# Patient Record
Sex: Female | Born: 1935 | Race: Black or African American | Hispanic: No | Smoking: Never smoker
Health system: Southern US, Community
[De-identification: ages and names within clinical notes are randomized; demographics above are authoritative.]

## PROBLEM LIST (undated history)

## (undated) DIAGNOSIS — E162 Hypoglycemia, unspecified: Secondary | ICD-10-CM

## (undated) DIAGNOSIS — J45909 Unspecified asthma, uncomplicated: Secondary | ICD-10-CM

## (undated) DIAGNOSIS — K5790 Diverticulosis of intestine, part unspecified, without perforation or abscess without bleeding: Secondary | ICD-10-CM

## (undated) DIAGNOSIS — M1990 Unspecified osteoarthritis, unspecified site: Secondary | ICD-10-CM

## (undated) DIAGNOSIS — E079 Disorder of thyroid, unspecified: Secondary | ICD-10-CM

## (undated) DIAGNOSIS — N289 Disorder of kidney and ureter, unspecified: Secondary | ICD-10-CM

## (undated) DIAGNOSIS — K219 Gastro-esophageal reflux disease without esophagitis: Secondary | ICD-10-CM

## (undated) DIAGNOSIS — I509 Heart failure, unspecified: Secondary | ICD-10-CM

## (undated) DIAGNOSIS — I2699 Other pulmonary embolism without acute cor pulmonale: Secondary | ICD-10-CM

## (undated) DIAGNOSIS — I639 Cerebral infarction, unspecified: Secondary | ICD-10-CM

## (undated) DIAGNOSIS — I4891 Unspecified atrial fibrillation: Secondary | ICD-10-CM

## (undated) DIAGNOSIS — I10 Essential (primary) hypertension: Secondary | ICD-10-CM

## (undated) DIAGNOSIS — I2510 Atherosclerotic heart disease of native coronary artery without angina pectoris: Secondary | ICD-10-CM

## (undated) HISTORY — PX: PACEMAKER PLACEMENT: SHX43

## (undated) HISTORY — PX: CYSTOSCOPY: SUR368

## (undated) HISTORY — PX: JOINT REPLACEMENT: SHX530

## (undated) HISTORY — PX: APPENDECTOMY: SHX54

---

## 1998-09-20 ENCOUNTER — Encounter: Admission: RE | Admit: 1998-09-20 | Discharge: 1998-09-20 | Payer: Self-pay | Admitting: Family Medicine

## 1998-10-12 ENCOUNTER — Encounter: Admission: RE | Admit: 1998-10-12 | Discharge: 1998-10-12 | Payer: Self-pay | Admitting: Family Medicine

## 1998-10-19 ENCOUNTER — Encounter: Admission: RE | Admit: 1998-10-19 | Discharge: 1998-10-19 | Payer: Self-pay | Admitting: Family Medicine

## 1998-11-09 ENCOUNTER — Encounter: Admission: RE | Admit: 1998-11-09 | Discharge: 1998-11-09 | Payer: Self-pay | Admitting: Family Medicine

## 1998-11-29 ENCOUNTER — Encounter: Admission: RE | Admit: 1998-11-29 | Discharge: 1998-11-29 | Payer: Self-pay | Admitting: Family Medicine

## 1999-01-30 ENCOUNTER — Encounter: Admission: RE | Admit: 1999-01-30 | Discharge: 1999-01-30 | Payer: Self-pay | Admitting: Family Medicine

## 1999-07-13 ENCOUNTER — Encounter: Admission: RE | Admit: 1999-07-13 | Discharge: 1999-07-13 | Payer: Self-pay | Admitting: Family Medicine

## 1999-07-16 ENCOUNTER — Ambulatory Visit (HOSPITAL_COMMUNITY): Admission: RE | Admit: 1999-07-16 | Discharge: 1999-07-16 | Payer: Self-pay | Admitting: Family Medicine

## 1999-07-16 ENCOUNTER — Encounter: Admission: RE | Admit: 1999-07-16 | Discharge: 1999-07-16 | Payer: Self-pay | Admitting: Family Medicine

## 1999-07-20 ENCOUNTER — Encounter: Admission: RE | Admit: 1999-07-20 | Discharge: 1999-07-20 | Payer: Self-pay | Admitting: Family Medicine

## 1999-07-31 ENCOUNTER — Encounter (INDEPENDENT_AMBULATORY_CARE_PROVIDER_SITE_OTHER): Payer: Self-pay | Admitting: *Deleted

## 1999-08-17 ENCOUNTER — Encounter: Admission: RE | Admit: 1999-08-17 | Discharge: 1999-08-17 | Payer: Self-pay | Admitting: Family Medicine

## 1999-12-07 ENCOUNTER — Encounter: Admission: RE | Admit: 1999-12-07 | Discharge: 1999-12-07 | Payer: Self-pay | Admitting: Family Medicine

## 2000-01-02 ENCOUNTER — Encounter: Admission: RE | Admit: 2000-01-02 | Discharge: 2000-01-02 | Payer: Self-pay | Admitting: Family Medicine

## 2000-01-17 ENCOUNTER — Encounter: Admission: RE | Admit: 2000-01-17 | Discharge: 2000-01-17 | Payer: Self-pay | Admitting: Family Medicine

## 2000-02-08 ENCOUNTER — Encounter: Admission: RE | Admit: 2000-02-08 | Discharge: 2000-02-08 | Payer: Self-pay | Admitting: Family Medicine

## 2000-05-16 ENCOUNTER — Encounter: Admission: RE | Admit: 2000-05-16 | Discharge: 2000-05-16 | Payer: Self-pay | Admitting: Family Medicine

## 2000-07-01 ENCOUNTER — Encounter: Admission: RE | Admit: 2000-07-01 | Discharge: 2000-07-01 | Payer: Self-pay | Admitting: Family Medicine

## 2000-07-03 ENCOUNTER — Encounter: Payer: Self-pay | Admitting: Emergency Medicine

## 2000-07-03 ENCOUNTER — Emergency Department (HOSPITAL_COMMUNITY): Admission: EM | Admit: 2000-07-03 | Discharge: 2000-07-03 | Payer: Self-pay | Admitting: Emergency Medicine

## 2000-07-11 ENCOUNTER — Ambulatory Visit (HOSPITAL_COMMUNITY): Admission: RE | Admit: 2000-07-11 | Discharge: 2000-07-11 | Payer: Self-pay | Admitting: Otolaryngology

## 2000-07-11 ENCOUNTER — Encounter: Payer: Self-pay | Admitting: Otolaryngology

## 2000-10-01 ENCOUNTER — Encounter: Admission: RE | Admit: 2000-10-01 | Discharge: 2000-10-01 | Payer: Self-pay | Admitting: Family Medicine

## 2001-04-17 ENCOUNTER — Ambulatory Visit (HOSPITAL_COMMUNITY): Admission: RE | Admit: 2001-04-17 | Discharge: 2001-04-17 | Payer: Self-pay | Admitting: Family Medicine

## 2001-04-17 ENCOUNTER — Encounter: Admission: RE | Admit: 2001-04-17 | Discharge: 2001-04-17 | Payer: Self-pay | Admitting: Family Medicine

## 2001-04-24 ENCOUNTER — Encounter: Admission: RE | Admit: 2001-04-24 | Discharge: 2001-04-24 | Payer: Self-pay | Admitting: Family Medicine

## 2001-08-21 ENCOUNTER — Encounter: Admission: RE | Admit: 2001-08-21 | Discharge: 2001-08-21 | Payer: Self-pay | Admitting: Family Medicine

## 2001-11-13 ENCOUNTER — Encounter: Admission: RE | Admit: 2001-11-13 | Discharge: 2001-11-13 | Payer: Self-pay | Admitting: Family Medicine

## 2002-03-12 ENCOUNTER — Encounter: Admission: RE | Admit: 2002-03-12 | Discharge: 2002-03-12 | Payer: Self-pay | Admitting: Family Medicine

## 2002-08-20 ENCOUNTER — Encounter: Admission: RE | Admit: 2002-08-20 | Discharge: 2002-08-20 | Payer: Self-pay | Admitting: Family Medicine

## 2002-10-08 ENCOUNTER — Encounter: Admission: RE | Admit: 2002-10-08 | Discharge: 2002-10-08 | Payer: Self-pay | Admitting: Family Medicine

## 2003-05-20 ENCOUNTER — Encounter: Admission: RE | Admit: 2003-05-20 | Discharge: 2003-05-20 | Payer: Self-pay | Admitting: Family Medicine

## 2003-10-28 ENCOUNTER — Encounter: Admission: RE | Admit: 2003-10-28 | Discharge: 2003-10-28 | Payer: Self-pay | Admitting: Family Medicine

## 2004-10-26 ENCOUNTER — Ambulatory Visit: Payer: Self-pay | Admitting: Sports Medicine

## 2004-11-14 ENCOUNTER — Ambulatory Visit: Payer: Self-pay | Admitting: Family Medicine

## 2005-07-15 ENCOUNTER — Ambulatory Visit: Payer: Self-pay | Admitting: Family Medicine

## 2005-10-07 ENCOUNTER — Ambulatory Visit: Payer: Self-pay | Admitting: Family Medicine

## 2005-11-13 ENCOUNTER — Ambulatory Visit: Payer: Self-pay | Admitting: Family Medicine

## 2005-12-17 ENCOUNTER — Ambulatory Visit: Payer: Self-pay | Admitting: Sports Medicine

## 2006-04-15 ENCOUNTER — Ambulatory Visit: Payer: Self-pay | Admitting: Family Medicine

## 2006-04-25 ENCOUNTER — Encounter: Admission: RE | Admit: 2006-04-25 | Discharge: 2006-04-25 | Payer: Self-pay | Admitting: Sports Medicine

## 2006-04-25 ENCOUNTER — Ambulatory Visit: Payer: Self-pay | Admitting: Family Medicine

## 2006-05-14 ENCOUNTER — Ambulatory Visit: Payer: Self-pay | Admitting: Family Medicine

## 2006-07-04 ENCOUNTER — Ambulatory Visit: Payer: Self-pay | Admitting: Family Medicine

## 2006-07-17 ENCOUNTER — Ambulatory Visit: Payer: Self-pay | Admitting: Family Medicine

## 2006-07-30 DIAGNOSIS — K219 Gastro-esophageal reflux disease without esophagitis: Secondary | ICD-10-CM

## 2006-07-30 DIAGNOSIS — K5790 Diverticulosis of intestine, part unspecified, without perforation or abscess without bleeding: Secondary | ICD-10-CM

## 2006-07-30 HISTORY — PX: ESOPHAGOGASTRODUODENOSCOPY: SHX1529

## 2006-07-30 HISTORY — PX: COLONOSCOPY: SHX174

## 2006-07-30 HISTORY — DX: Gastro-esophageal reflux disease without esophagitis: K21.9

## 2006-07-30 HISTORY — DX: Diverticulosis of intestine, part unspecified, without perforation or abscess without bleeding: K57.90

## 2006-08-07 ENCOUNTER — Emergency Department (HOSPITAL_COMMUNITY): Admission: EM | Admit: 2006-08-07 | Discharge: 2006-08-07 | Payer: Self-pay | Admitting: Emergency Medicine

## 2006-08-07 ENCOUNTER — Ambulatory Visit (HOSPITAL_COMMUNITY): Admission: RE | Admit: 2006-08-07 | Discharge: 2006-08-07 | Payer: Self-pay | Admitting: Gastroenterology

## 2006-08-13 ENCOUNTER — Ambulatory Visit: Payer: Self-pay | Admitting: Sports Medicine

## 2006-12-25 ENCOUNTER — Ambulatory Visit: Payer: Self-pay | Admitting: Family Medicine

## 2006-12-27 ENCOUNTER — Emergency Department (HOSPITAL_COMMUNITY): Admission: EM | Admit: 2006-12-27 | Discharge: 2006-12-27 | Payer: Self-pay | Admitting: Emergency Medicine

## 2007-01-16 ENCOUNTER — Encounter: Payer: Self-pay | Admitting: Family Medicine

## 2007-01-16 ENCOUNTER — Ambulatory Visit: Payer: Self-pay | Admitting: Family Medicine

## 2007-01-16 LAB — CONVERTED CEMR LAB
ALT: 13 units/L (ref 0–35)
AST: 14 units/L (ref 0–37)
Albumin: 3.8 g/dL (ref 3.5–5.2)
Alkaline Phosphatase: 75 units/L (ref 39–117)
CO2: 24 meq/L (ref 19–32)
Calcium: 9.3 mg/dL (ref 8.4–10.5)
Chloride: 98 meq/L (ref 96–112)
Creatinine, Ser: 1.36 mg/dL — ABNORMAL HIGH (ref 0.40–1.20)
Total Bilirubin: 0.3 mg/dL (ref 0.3–1.2)

## 2007-01-24 ENCOUNTER — Emergency Department (HOSPITAL_COMMUNITY): Admission: EM | Admit: 2007-01-24 | Discharge: 2007-01-24 | Payer: Self-pay | Admitting: Emergency Medicine

## 2007-01-30 ENCOUNTER — Encounter: Payer: Self-pay | Admitting: Family Medicine

## 2007-01-30 ENCOUNTER — Ambulatory Visit: Payer: Self-pay | Admitting: Family Medicine

## 2007-01-30 LAB — CONVERTED CEMR LAB
CO2: 25 meq/L (ref 19–32)
Calcium: 9.3 mg/dL (ref 8.4–10.5)
Chloride: 101 meq/L (ref 96–112)
Creatinine, Ser: 1.51 mg/dL — ABNORMAL HIGH (ref 0.40–1.20)
Glucose, Bld: 94 mg/dL (ref 70–99)

## 2007-02-26 DIAGNOSIS — I4891 Unspecified atrial fibrillation: Secondary | ICD-10-CM | POA: Insufficient documentation

## 2007-02-26 DIAGNOSIS — R42 Dizziness and giddiness: Secondary | ICD-10-CM | POA: Insufficient documentation

## 2007-02-26 DIAGNOSIS — K5730 Diverticulosis of large intestine without perforation or abscess without bleeding: Secondary | ICD-10-CM | POA: Insufficient documentation

## 2007-02-26 DIAGNOSIS — I10 Essential (primary) hypertension: Secondary | ICD-10-CM | POA: Insufficient documentation

## 2007-02-26 DIAGNOSIS — J309 Allergic rhinitis, unspecified: Secondary | ICD-10-CM | POA: Insufficient documentation

## 2007-02-26 DIAGNOSIS — M1990 Unspecified osteoarthritis, unspecified site: Secondary | ICD-10-CM

## 2007-02-26 DIAGNOSIS — M79609 Pain in unspecified limb: Secondary | ICD-10-CM

## 2007-02-26 DIAGNOSIS — E669 Obesity, unspecified: Secondary | ICD-10-CM | POA: Insufficient documentation

## 2007-02-27 ENCOUNTER — Encounter (INDEPENDENT_AMBULATORY_CARE_PROVIDER_SITE_OTHER): Payer: Self-pay | Admitting: *Deleted

## 2007-04-02 ENCOUNTER — Encounter: Payer: Self-pay | Admitting: Family Medicine

## 2007-05-13 ENCOUNTER — Encounter (INDEPENDENT_AMBULATORY_CARE_PROVIDER_SITE_OTHER): Payer: Self-pay | Admitting: *Deleted

## 2007-07-17 ENCOUNTER — Encounter (INDEPENDENT_AMBULATORY_CARE_PROVIDER_SITE_OTHER): Payer: Self-pay | Admitting: Family Medicine

## 2015-02-26 ENCOUNTER — Inpatient Hospital Stay (HOSPITAL_COMMUNITY)
Admission: EM | Admit: 2015-02-26 | Discharge: 2015-03-02 | DRG: 392 | Disposition: A | Discharge status: Skilled Nursing Facility | Payer: Medicare Other | Attending: Family Medicine | Admitting: Family Medicine

## 2015-02-26 ENCOUNTER — Emergency Department (HOSPITAL_COMMUNITY): Payer: Medicare Other

## 2015-02-26 ENCOUNTER — Encounter (HOSPITAL_COMMUNITY): Payer: Self-pay | Admitting: Emergency Medicine

## 2015-02-26 DIAGNOSIS — Z7982 Long term (current) use of aspirin: Secondary | ICD-10-CM

## 2015-02-26 DIAGNOSIS — J4520 Mild intermittent asthma, uncomplicated: Secondary | ICD-10-CM

## 2015-02-26 DIAGNOSIS — D72819 Decreased white blood cell count, unspecified: Secondary | ICD-10-CM

## 2015-02-26 DIAGNOSIS — R112 Nausea with vomiting, unspecified: Secondary | ICD-10-CM | POA: Diagnosis present

## 2015-02-26 DIAGNOSIS — R1084 Generalized abdominal pain: Secondary | ICD-10-CM

## 2015-02-26 DIAGNOSIS — N183 Chronic kidney disease, stage 3 unspecified: Secondary | ICD-10-CM | POA: Diagnosis present

## 2015-02-26 DIAGNOSIS — R1314 Dysphagia, pharyngoesophageal phase: Secondary | ICD-10-CM

## 2015-02-26 DIAGNOSIS — A084 Viral intestinal infection, unspecified: Secondary | ICD-10-CM | POA: Diagnosis not present

## 2015-02-26 DIAGNOSIS — R1310 Dysphagia, unspecified: Secondary | ICD-10-CM

## 2015-02-26 DIAGNOSIS — I10 Essential (primary) hypertension: Secondary | ICD-10-CM | POA: Diagnosis present

## 2015-02-26 DIAGNOSIS — J45909 Unspecified asthma, uncomplicated: Secondary | ICD-10-CM | POA: Diagnosis present

## 2015-02-26 DIAGNOSIS — N179 Acute kidney failure, unspecified: Secondary | ICD-10-CM | POA: Diagnosis present

## 2015-02-26 DIAGNOSIS — I129 Hypertensive chronic kidney disease with stage 1 through stage 4 chronic kidney disease, or unspecified chronic kidney disease: Secondary | ICD-10-CM | POA: Diagnosis present

## 2015-02-26 DIAGNOSIS — Z86711 Personal history of pulmonary embolism: Secondary | ICD-10-CM

## 2015-02-26 DIAGNOSIS — I5032 Chronic diastolic (congestive) heart failure: Secondary | ICD-10-CM | POA: Diagnosis present

## 2015-02-26 DIAGNOSIS — Z96653 Presence of artificial knee joint, bilateral: Secondary | ICD-10-CM | POA: Diagnosis present

## 2015-02-26 DIAGNOSIS — Z7901 Long term (current) use of anticoagulants: Secondary | ICD-10-CM

## 2015-02-26 DIAGNOSIS — Z950 Presence of cardiac pacemaker: Secondary | ICD-10-CM

## 2015-02-26 DIAGNOSIS — E785 Hyperlipidemia, unspecified: Secondary | ICD-10-CM | POA: Diagnosis present

## 2015-02-26 DIAGNOSIS — Z8673 Personal history of transient ischemic attack (TIA), and cerebral infarction without residual deficits: Secondary | ICD-10-CM

## 2015-02-26 DIAGNOSIS — D649 Anemia, unspecified: Secondary | ICD-10-CM

## 2015-02-26 DIAGNOSIS — I4891 Unspecified atrial fibrillation: Secondary | ICD-10-CM | POA: Diagnosis present

## 2015-02-26 DIAGNOSIS — M069 Rheumatoid arthritis, unspecified: Secondary | ICD-10-CM | POA: Diagnosis present

## 2015-02-26 DIAGNOSIS — R079 Chest pain, unspecified: Secondary | ICD-10-CM | POA: Diagnosis present

## 2015-02-26 DIAGNOSIS — Z79899 Other long term (current) drug therapy: Secondary | ICD-10-CM

## 2015-02-26 DIAGNOSIS — I482 Chronic atrial fibrillation: Secondary | ICD-10-CM | POA: Diagnosis present

## 2015-02-26 DIAGNOSIS — K224 Dyskinesia of esophagus: Secondary | ICD-10-CM | POA: Diagnosis present

## 2015-02-26 DIAGNOSIS — I2510 Atherosclerotic heart disease of native coronary artery without angina pectoris: Secondary | ICD-10-CM | POA: Diagnosis present

## 2015-02-26 DIAGNOSIS — D61818 Other pancytopenia: Secondary | ICD-10-CM | POA: Diagnosis present

## 2015-02-26 DIAGNOSIS — E860 Dehydration: Secondary | ICD-10-CM | POA: Diagnosis present

## 2015-02-26 DIAGNOSIS — R197 Diarrhea, unspecified: Secondary | ICD-10-CM

## 2015-02-26 DIAGNOSIS — I509 Heart failure, unspecified: Secondary | ICD-10-CM

## 2015-02-26 HISTORY — DX: Other pulmonary embolism without acute cor pulmonale: I26.99

## 2015-02-26 HISTORY — DX: Essential (primary) hypertension: I10

## 2015-02-26 HISTORY — DX: Unspecified osteoarthritis, unspecified site: M19.90

## 2015-02-26 HISTORY — DX: Cerebral infarction, unspecified: I63.9

## 2015-02-26 HISTORY — DX: Unspecified atrial fibrillation: I48.91

## 2015-02-26 HISTORY — DX: Unspecified asthma, uncomplicated: J45.909

## 2015-02-26 HISTORY — DX: Heart failure, unspecified: I50.9

## 2015-02-26 HISTORY — DX: Disorder of kidney and ureter, unspecified: N28.9

## 2015-02-26 HISTORY — DX: Atherosclerotic heart disease of native coronary artery without angina pectoris: I25.10

## 2015-02-26 HISTORY — DX: Diverticulosis of intestine, part unspecified, without perforation or abscess without bleeding: K57.90

## 2015-02-26 HISTORY — DX: Disorder of thyroid, unspecified: E07.9

## 2015-02-26 HISTORY — DX: Hypoglycemia, unspecified: E16.2

## 2015-02-26 HISTORY — DX: Gastro-esophageal reflux disease without esophagitis: K21.9

## 2015-02-26 LAB — URINALYSIS, ROUTINE W REFLEX MICROSCOPIC
Bilirubin Urine: NEGATIVE
Glucose, UA: NEGATIVE mg/dL
KETONES UR: NEGATIVE mg/dL
NITRITE: NEGATIVE
PH: 7 (ref 5.0–8.0)
PROTEIN: NEGATIVE mg/dL
Specific Gravity, Urine: 1.011 (ref 1.005–1.030)
Urobilinogen, UA: 1 mg/dL (ref 0.0–1.0)

## 2015-02-26 LAB — I-STAT TROPONIN, ED: Troponin i, poc: 0.01 ng/mL (ref 0.00–0.08)

## 2015-02-26 LAB — CBC WITH DIFFERENTIAL/PLATELET
BASOS ABS: 0 10*3/uL (ref 0.0–0.1)
Basophils Relative: 1 % (ref 0–1)
EOS ABS: 0.1 10*3/uL (ref 0.0–0.7)
Eosinophils Relative: 3 % (ref 0–5)
HCT: 26.3 % — ABNORMAL LOW (ref 36.0–46.0)
HEMOGLOBIN: 8.9 g/dL — AB (ref 12.0–15.0)
Lymphocytes Relative: 57 % — ABNORMAL HIGH (ref 12–46)
Lymphs Abs: 1 10*3/uL (ref 0.7–4.0)
MCH: 30 pg (ref 26.0–34.0)
MCHC: 33.8 g/dL (ref 30.0–36.0)
MCV: 88.6 fL (ref 78.0–100.0)
MONO ABS: 0.2 10*3/uL (ref 0.1–1.0)
Monocytes Relative: 11 % (ref 3–12)
NEUTROS ABS: 0.5 10*3/uL — AB (ref 1.7–7.7)
NEUTROS PCT: 28 % — AB (ref 43–77)
Platelets: 133 10*3/uL — ABNORMAL LOW (ref 150–400)
RBC: 2.97 MIL/uL — AB (ref 3.87–5.11)
RDW: 20.4 % — ABNORMAL HIGH (ref 11.5–15.5)
WBC: 1.8 10*3/uL — ABNORMAL LOW (ref 4.0–10.5)

## 2015-02-26 LAB — COMPREHENSIVE METABOLIC PANEL
ALBUMIN: 3.1 g/dL — AB (ref 3.5–5.2)
ALT: 13 U/L (ref 0–35)
AST: 22 U/L (ref 0–37)
Alkaline Phosphatase: 79 U/L (ref 39–117)
Anion gap: 6 (ref 5–15)
BILIRUBIN TOTAL: 0.9 mg/dL (ref 0.3–1.2)
BUN: 16 mg/dL (ref 6–23)
CO2: 27 mmol/L (ref 19–32)
CREATININE: 1.34 mg/dL — AB (ref 0.50–1.10)
Calcium: 8.7 mg/dL (ref 8.4–10.5)
Chloride: 105 mmol/L (ref 96–112)
GFR calc non Af Amer: 35 mL/min — ABNORMAL LOW (ref >90)
GFR, EST AFRICAN AMERICAN: 41 mL/min — AB (ref >90)
GLUCOSE: 92 mg/dL (ref 70–99)
Potassium: 3.6 mmol/L (ref 3.5–5.1)
SODIUM: 138 mmol/L (ref 135–145)
Total Protein: 6.7 g/dL (ref 6.0–8.3)

## 2015-02-26 LAB — URINE MICROSCOPIC-ADD ON

## 2015-02-26 LAB — LIPASE, BLOOD: LIPASE: 33 U/L (ref 11–59)

## 2015-02-26 LAB — PROTIME-INR
INR: 3.3 — ABNORMAL HIGH (ref 0.00–1.49)
Prothrombin Time: 33.8 seconds — ABNORMAL HIGH (ref 11.6–15.2)

## 2015-02-26 LAB — POC OCCULT BLOOD, ED: Fecal Occult Bld: POSITIVE — AB

## 2015-02-26 MED ORDER — ONDANSETRON 4 MG PO TBDP
4.0000 mg | ORAL_TABLET | Freq: Once | ORAL | Status: AC
  Administered 2015-02-26: 4 mg via ORAL
  Filled 2015-02-26: qty 1

## 2015-02-26 MED ORDER — GI COCKTAIL ~~LOC~~
30.0000 mL | Freq: Once | ORAL | Status: AC
  Administered 2015-02-26: 30 mL via ORAL
  Filled 2015-02-26: qty 30

## 2015-02-26 MED ORDER — OXYCODONE-ACETAMINOPHEN 5-325 MG PO TABS
1.0000 | ORAL_TABLET | Freq: Once | ORAL | Status: AC
  Administered 2015-02-26: 1 via ORAL
  Filled 2015-02-26: qty 1

## 2015-02-26 NOTE — ED Notes (Signed)
MD Miller at bedside. 

## 2015-02-26 NOTE — ED Notes (Signed)
Phlebotomy at bedside.

## 2015-02-26 NOTE — ED Notes (Signed)
PA Tori at bedside. 

## 2015-02-26 NOTE — ED Provider Notes (Signed)
CSN: [id number]     Arrival date & time 02/26/15  1832 History   First MD Initiated Contact with Patient 02/26/15 1836     No chief complaint on file.    (Consider location/radiation/quality/duration/timing/severity/associated sxs/prior Treatment) HPI  Patricia Klein is a 81 y.o. female with PMH of congestive heart failure, atrial fibrillation on Coumadin, coronary artery disease, stroke presenting with 2 weeks of nausea, vomiting, diarrhea. Patient states she has epigastric/chest pain that is worse with eating and is a burn. Pain is constant. No shortness of breath. Pt with history of CHF with decreased swelling in bilateral legs. Patient states that she was admitted for similar symptoms at [facility] and discharged 4 days ago. Patient has also seen her primary care provider and had repeat lab work done. Was diagnosed with thrombocytopenia and possible gastritis. Patient with GI follow-up March 8. She is also to follow-up with hematology. Patient denies any change in her symptoms she has not seen a what is going on. No new medication. She has stopped taking metoprolol   Past Medical History  Diagnosis Date  . Asthma   . Arthritis   . CHF (congestive heart failure)   . Thyroid disease   . Stroke   . Coronary artery disease   . Hypoglycemia   . Hypertension   . Renal disorder   . Atrial fibrillation    Past Surgical History  Procedure Laterality Date  . Joint replacement      bilateral knee and RT hip  . Pacemaker placement     No family history on file. History  Substance Use Topics  . Smoking status: Never Smoker   . Smokeless tobacco: Not on file  . Alcohol Use: No   OB History    No data available     Review of Systems 10 Systems reviewed and are negative for acute change except as noted in the HPI.    Allergies  Gabapentin; Penicillins; Sulfa antibiotics; and Hydrocodone-acetaminophen  Home Medications   Prior to Admission medications    Medication Sig Start Date End Date Taking? Authorizing Provider  albuterol (PROVENTIL HFA;VENTOLIN HFA) 108 (90 BASE) MCG/ACT inhaler Inhale 2 puffs into the lungs every 6 (six) hours as needed for wheezing or shortness of breath.    Yes Historical Provider, MD  aspirin EC 81 MG tablet Take 81 mg by mouth daily.   Yes Historical Provider, MD  cholecalciferol (VITAMIN D) 1000 UNITS tablet Take 1,000 Units by mouth daily.   Yes Historical Provider, MD  diltiazem (CARDIZEM) 30 MG tablet Take 30 mg by mouth 2 (two) times daily. 12/28/14  Yes Historical Provider, MD  furosemide (LASIX) 40 MG tablet Take 40 mg by mouth 2 (two) times daily. 12/28/14  Yes Historical Provider, MD  metoprolol (LOPRESSOR) 100 MG tablet Take 100 mg by mouth 2 (two) times daily.  02/02/15  Yes Historical Provider, MD  nitroGLYCERIN (NITROSTAT) 0.4 MG SL tablet Place 0.4 mg under the tongue every 5 (five) minutes as needed for chest pain.    Yes Historical Provider, MD  nystatin (MYCOSTATIN/NYSTOP) 100000 UNIT/GM POWD Apply topically 3 (three) times daily as needed (irritation under breasts, under stomach folds and vaginal area).  01/25/15  Yes Historical Provider, MD  oxyCODONE-acetaminophen (PERCOCET/ROXICET) 5-325 MG per tablet Take 1 tablet by mouth every 6 (six) hours as needed (pain).  02/17/15  Yes Historical Provider, MD  potassium chloride SA (K-DUR,KLOR-CON) 20 MEQ tablet Take 20 mEq by mouth daily.  02/22/15  Yes Historical Provider, MD  pravastatin (PRAVACHOL) 20 MG tablet Take 20 mg by mouth at bedtime.  02/23/15  Yes Historical Provider, MD  sennosides-docusate sodium (SENOKOT-S) 8.6-50 MG tablet Take 1 tablet by mouth daily as needed.    Yes Historical Provider, MD  traMADol (ULTRAM) 50 MG tablet Take 50 mg by mouth 2 (two) times daily. 02/17/15  Yes Historical Provider, MD  warfarin (COUMADIN) 2 MG tablet Take 1-2 mg by mouth daily after supper. Take 1/2 tablet (1 mg) on Wednesday and Friday at 5pm; take 1 tablet (2 mg)  on Sunday, Monday, Tuesday, Thursday, Saturday at 5pm 02/07/15  Yes Historical Provider, MD  methotrexate (RHEUMATREX) 2.5 MG tablet 15 mg once a week. On Mondays 02/07/15   Historical Provider, MD   BP 108/54 mmHg  Pulse 65  Temp(Src) 97.8 F (36.6 C) (Rectal)  Resp 18  SpO2 96% Physical Exam  Constitutional: She appears well-developed and well-nourished. No distress.  HENT:  Head: Normocephalic and atraumatic.  Eyes: Conjunctivae and EOM are normal. Right eye exhibits no discharge. Left eye exhibits no discharge.  Neck:  Elevated venous pulsations.  Cardiovascular: Normal rate and regular rhythm.   1+ pitting edema equal bilaterally  Pulmonary/Chest: Effort normal and breath sounds normal. No respiratory distress. She has no wheezes.  Abdominal: Soft. Bowel sounds are normal. She exhibits no distension. There is no tenderness.  Genitourinary:  External rectum without tenderness to palpation without visible fissures. Internal rectum with normal tone without masses or lesions. Stool light brown.  Nursing tech in room during exam.   Neurological: She is alert. She exhibits normal muscle tone. Coordination normal.  Skin: Skin is warm and dry. She is not diaphoretic.  Nursing note and vitals reviewed.   ED Course  Procedures (including critical care time) Labs Review Labs Reviewed  CBC WITH DIFFERENTIAL/PLATELET - Abnormal; Notable for the following:    WBC 1.8 (*)    RBC 2.97 (*)    Hemoglobin 8.9 (*)    HCT 26.3 (*)    RDW 20.4 (*)    Platelets 133 (*)    Neutrophils Relative % 28 (*)    Lymphocytes Relative 57 (*)    Neutro Abs 0.5 (*)    All other components within normal limits  COMPREHENSIVE METABOLIC PANEL - Abnormal; Notable for the following:    Creatinine, Ser 1.34 (*)    Albumin 3.1 (*)    GFR calc non Af Amer 35 (*)    GFR calc Af Amer 41 (*)    All other components within normal limits  PROTIME-INR - Abnormal; Notable for the following:    Prothrombin Time 33.8  (*)    INR 3.30 (*)    All other components within normal limits  URINALYSIS, ROUTINE W REFLEX MICROSCOPIC - Abnormal; Notable for the following:    APPearance CLOUDY (*)    Hgb urine dipstick SMALL (*)    Leukocytes, UA TRACE (*)    All other components within normal limits  URINE MICROSCOPIC-ADD ON - Abnormal; Notable for the following:    Squamous Epithelial / LPF MANY (*)    All other components within normal limits  POC OCCULT BLOOD, ED - Abnormal; Notable for the following:    Fecal Occult Bld POSITIVE (*)    All other components within normal limits  LIPASE, BLOOD  I-STAT TROPOININ, ED    Imaging Review Dg Chest 2 View  02/26/2015   CLINICAL DATA:  Chest pain and weakness for 2 days  EXAM: CHEST  2 VIEW  COMPARISON:  02/14/2015  FINDINGS: Cardiac shadow remains enlarged. A pacing device is again seen. Degenerative changes of the shoulder joints are again noted bilaterally. No focal infiltrate or sizable effusion is seen.  IMPRESSION: No acute abnormality noted.   Electronically Signed   By: Alcide Clever M.D.   On: 02/26/2015 20:06     EKG Interpretation   Date/Time:  Sunday February 26 2015 18:53:27 EST Ventricular Rate:  73 PR Interval:  94 QRS Duration: 117 QT Interval:  439 QTC Calculation: 484 R Axis:   82 Text Interpretation:  Ventricular-paced complexes No further rhythm  analysis attempted due to paced rhythm Incomplete right bundle branch  block Borderline low voltage, extremity leads Baseline wander in lead(s)  V4 since 2007, no sig changes Confirmed by Hyacinth Meeker  MD, BRIAN ([id number]) on  02/26/2015 7:59:46 PM      MDM   Final diagnoses:  Generalized abdominal pain  Chest pain, unspecified chest pain type  Nausea vomiting and diarrhea  Anemia, unspecified anemia type  Leukopenia   Pt with complaint of nausea, vomiting, diarrhea as well as epigastric abdominal tenderness for 2 weeks. Pain is burning and worse with eating. Pt also with admission to [facility]  regional found to have thrombocytopenia and leukocytopenia. Pt on methotrexate for RA but no longer taking. VSS. Patient with generalized abdominal pain as well as chest pain reproducible on exam. Patient without electrolyte abnormalities with with blood cell count 1.8 which is decreased from 2 days ago that was 3.6 at [facility]. Patient also with anemia of 8.9 which is at her baseline. She is on anticoagulants with INR of 3.3 and reports dark stools. Fecal positive and stool light brown. Otherwise troponin negative EKG without acute abnormalities. UA without any evidence of infection. Patient given GI cocktail in ED with resolution of her symptoms. I doubt acute abdominal process. Consult to hospitalist for observation with serial abdominal exams. Spoke with Dr. Clyde Lundborg who agrees to evaluate the patient and admit.  Discussed return precautions with patient. Discussed all results and patient verbalizes understanding and agrees with plan.  This is a shared patient. This patient was discussed with the physician who saw and evaluated the patient and agrees with the plan.     Louann Sjogren, PA-C 02/27/15 0110  Vida Roller, MD 02/27/15 2010

## 2015-02-26 NOTE — ED Notes (Signed)
Per EMS, pt from home c/o of N/V/D x 2 weeks. Pt states she has no appetite. Pt was seen at [facility] on 2/18 for similar symptoms. Pt c/o of 8/10 mouth and throat soreness. NAD noted. VSS.

## 2015-02-27 ENCOUNTER — Encounter (HOSPITAL_COMMUNITY): Payer: Self-pay | Admitting: Internal Medicine

## 2015-02-27 DIAGNOSIS — R197 Diarrhea, unspecified: Secondary | ICD-10-CM

## 2015-02-27 DIAGNOSIS — I2510 Atherosclerotic heart disease of native coronary artery without angina pectoris: Secondary | ICD-10-CM | POA: Diagnosis present

## 2015-02-27 DIAGNOSIS — J45909 Unspecified asthma, uncomplicated: Secondary | ICD-10-CM | POA: Diagnosis present

## 2015-02-27 DIAGNOSIS — I509 Heart failure, unspecified: Secondary | ICD-10-CM

## 2015-02-27 DIAGNOSIS — K224 Dyskinesia of esophagus: Secondary | ICD-10-CM | POA: Diagnosis present

## 2015-02-27 DIAGNOSIS — Z79899 Other long term (current) drug therapy: Secondary | ICD-10-CM | POA: Diagnosis not present

## 2015-02-27 DIAGNOSIS — Z950 Presence of cardiac pacemaker: Secondary | ICD-10-CM | POA: Diagnosis not present

## 2015-02-27 DIAGNOSIS — J4520 Mild intermittent asthma, uncomplicated: Secondary | ICD-10-CM

## 2015-02-27 DIAGNOSIS — R112 Nausea with vomiting, unspecified: Secondary | ICD-10-CM | POA: Diagnosis not present

## 2015-02-27 DIAGNOSIS — I482 Chronic atrial fibrillation: Secondary | ICD-10-CM | POA: Diagnosis present

## 2015-02-27 DIAGNOSIS — Z86711 Personal history of pulmonary embolism: Secondary | ICD-10-CM | POA: Diagnosis not present

## 2015-02-27 DIAGNOSIS — D61818 Other pancytopenia: Secondary | ICD-10-CM | POA: Diagnosis present

## 2015-02-27 DIAGNOSIS — I481 Persistent atrial fibrillation: Secondary | ICD-10-CM

## 2015-02-27 DIAGNOSIS — M069 Rheumatoid arthritis, unspecified: Secondary | ICD-10-CM

## 2015-02-27 DIAGNOSIS — I5032 Chronic diastolic (congestive) heart failure: Secondary | ICD-10-CM | POA: Diagnosis present

## 2015-02-27 DIAGNOSIS — R1314 Dysphagia, pharyngoesophageal phase: Secondary | ICD-10-CM | POA: Diagnosis present

## 2015-02-27 DIAGNOSIS — R079 Chest pain, unspecified: Secondary | ICD-10-CM

## 2015-02-27 DIAGNOSIS — N183 Chronic kidney disease, stage 3 unspecified: Secondary | ICD-10-CM | POA: Diagnosis present

## 2015-02-27 DIAGNOSIS — Z7901 Long term (current) use of anticoagulants: Secondary | ICD-10-CM | POA: Diagnosis not present

## 2015-02-27 DIAGNOSIS — A084 Viral intestinal infection, unspecified: Secondary | ICD-10-CM | POA: Diagnosis present

## 2015-02-27 DIAGNOSIS — Z7982 Long term (current) use of aspirin: Secondary | ICD-10-CM | POA: Diagnosis not present

## 2015-02-27 DIAGNOSIS — I129 Hypertensive chronic kidney disease with stage 1 through stage 4 chronic kidney disease, or unspecified chronic kidney disease: Secondary | ICD-10-CM | POA: Diagnosis present

## 2015-02-27 DIAGNOSIS — N179 Acute kidney failure, unspecified: Secondary | ICD-10-CM | POA: Diagnosis present

## 2015-02-27 DIAGNOSIS — Z8673 Personal history of transient ischemic attack (TIA), and cerebral infarction without residual deficits: Secondary | ICD-10-CM | POA: Diagnosis not present

## 2015-02-27 DIAGNOSIS — Z96653 Presence of artificial knee joint, bilateral: Secondary | ICD-10-CM | POA: Diagnosis present

## 2015-02-27 DIAGNOSIS — R1310 Dysphagia, unspecified: Secondary | ICD-10-CM | POA: Diagnosis present

## 2015-02-27 DIAGNOSIS — I10 Essential (primary) hypertension: Secondary | ICD-10-CM | POA: Diagnosis present

## 2015-02-27 DIAGNOSIS — E860 Dehydration: Secondary | ICD-10-CM | POA: Diagnosis present

## 2015-02-27 DIAGNOSIS — E785 Hyperlipidemia, unspecified: Secondary | ICD-10-CM | POA: Diagnosis present

## 2015-02-27 LAB — URINE MICROSCOPIC-ADD ON

## 2015-02-27 LAB — COMPREHENSIVE METABOLIC PANEL
ALBUMIN: 3 g/dL — AB (ref 3.5–5.2)
ALT: 15 U/L (ref 0–35)
ANION GAP: 7 (ref 5–15)
AST: 25 U/L (ref 0–37)
Alkaline Phosphatase: 73 U/L (ref 39–117)
BUN: 14 mg/dL (ref 6–23)
CHLORIDE: 108 mmol/L (ref 96–112)
CO2: 26 mmol/L (ref 19–32)
Calcium: 8.8 mg/dL (ref 8.4–10.5)
Creatinine, Ser: 1.29 mg/dL — ABNORMAL HIGH (ref 0.50–1.10)
GFR, EST AFRICAN AMERICAN: 43 mL/min — AB (ref >90)
GFR, EST NON AFRICAN AMERICAN: 37 mL/min — AB (ref >90)
Glucose, Bld: 86 mg/dL (ref 70–99)
Potassium: 3.9 mmol/L (ref 3.5–5.1)
Sodium: 141 mmol/L (ref 135–145)
Total Bilirubin: 0.9 mg/dL (ref 0.3–1.2)
Total Protein: 6.4 g/dL (ref 6.0–8.3)

## 2015-02-27 LAB — URINALYSIS, ROUTINE W REFLEX MICROSCOPIC
Bilirubin Urine: NEGATIVE
Glucose, UA: NEGATIVE mg/dL
KETONES UR: NEGATIVE mg/dL
Leukocytes, UA: NEGATIVE
Nitrite: NEGATIVE
Protein, ur: NEGATIVE mg/dL
SPECIFIC GRAVITY, URINE: 1.013 (ref 1.005–1.030)
Urobilinogen, UA: 1 mg/dL (ref 0.0–1.0)
pH: 6 (ref 5.0–8.0)

## 2015-02-27 LAB — PATHOLOGIST SMEAR REVIEW

## 2015-02-27 LAB — CBC
HCT: 24.9 % — ABNORMAL LOW (ref 36.0–46.0)
Hemoglobin: 8.6 g/dL — ABNORMAL LOW (ref 12.0–15.0)
MCH: 30.8 pg (ref 26.0–34.0)
MCHC: 34.5 g/dL (ref 30.0–36.0)
MCV: 89.2 fL (ref 78.0–100.0)
PLATELETS: 124 10*3/uL — AB (ref 150–400)
RBC: 2.79 MIL/uL — AB (ref 3.87–5.11)
RDW: 20.8 % — ABNORMAL HIGH (ref 11.5–15.5)
WBC: 1.7 10*3/uL — AB (ref 4.0–10.5)

## 2015-02-27 LAB — PROCALCITONIN: Procalcitonin: <0.1 ng/mL

## 2015-02-27 LAB — TROPONIN I
Troponin I: <0.03 ng/mL (ref <0.031)
Troponin I: <0.03 ng/mL (ref <0.031)

## 2015-02-27 LAB — LACTIC ACID, PLASMA
LACTIC ACID, VENOUS: 1.3 mmol/L (ref 0.5–2.0)
LACTIC ACID, VENOUS: 1 mmol/L (ref 0.5–2.0)

## 2015-02-27 LAB — PROTIME-INR
INR: 3.09 — ABNORMAL HIGH (ref 0.00–1.49)
PROTHROMBIN TIME: 32.1 s — AB (ref 11.6–15.2)

## 2015-02-27 LAB — BRAIN NATRIURETIC PEPTIDE: B NATRIURETIC PEPTIDE 5: 426 pg/mL — AB (ref 0.0–100.0)

## 2015-02-27 MED ORDER — NYSTATIN 100000 UNIT/GM EX POWD: Freq: Three times a day (TID) | CUTANEOUS | Status: DC | PRN

## 2015-02-27 MED ORDER — CHLORHEXIDINE GLUCONATE 0.12 % MT SOLN
15.0000 mL | Freq: Two times a day (BID) | OROMUCOSAL | Status: DC
  Administered 2015-02-27 – 2015-03-02 (×7): 15 mL via OROMUCOSAL
  Filled 2015-02-27 (×9): qty 15

## 2015-02-27 MED ORDER — SODIUM CHLORIDE 0.9 % IJ SOLN
3.0000 mL | Freq: Two times a day (BID) | INTRAMUSCULAR | Status: DC
  Administered 2015-02-27 – 2015-03-02 (×5): 3 mL via INTRAVENOUS

## 2015-02-27 MED ORDER — CETYLPYRIDINIUM CHLORIDE 0.05 % MT LIQD
7.0000 mL | Freq: Two times a day (BID) | OROMUCOSAL | Status: DC
  Administered 2015-02-27 – 2015-03-02 (×6): 7 mL via OROMUCOSAL

## 2015-02-27 MED ORDER — ASPIRIN EC 81 MG PO TBEC
81.0000 mg | DELAYED_RELEASE_TABLET | Freq: Every day | ORAL | Status: DC
  Administered 2015-02-27 – 2015-03-02 (×4): 81 mg via ORAL
  Filled 2015-02-27 (×4): qty 1

## 2015-02-27 MED ORDER — PRAVASTATIN SODIUM 20 MG PO TABS
20.0000 mg | ORAL_TABLET | Freq: Every day | ORAL | Status: DC
  Administered 2015-02-27 – 2015-03-01 (×3): 20 mg via ORAL
  Filled 2015-02-27 (×4): qty 1

## 2015-02-27 MED ORDER — PANTOPRAZOLE SODIUM 40 MG PO TBEC
40.0000 mg | DELAYED_RELEASE_TABLET | Freq: Every day | ORAL | Status: DC
  Administered 2015-02-27 – 2015-03-02 (×4): 40 mg via ORAL
  Filled 2015-02-27 (×4): qty 1

## 2015-02-27 MED ORDER — NITROGLYCERIN 0.4 MG SL SUBL: 0.4000 mg | SUBLINGUAL_TABLET | SUBLINGUAL | Status: DC | PRN

## 2015-02-27 MED ORDER — TRAMADOL HCL 50 MG PO TABS
50.0000 mg | ORAL_TABLET | Freq: Two times a day (BID) | ORAL | Status: DC
  Administered 2015-02-27 – 2015-03-02 (×7): 50 mg via ORAL
  Filled 2015-02-27 (×7): qty 1

## 2015-02-27 MED ORDER — SODIUM CHLORIDE 0.9 % IV SOLN
INTRAVENOUS | Status: DC
  Administered 2015-02-27 – 2015-02-28 (×3): via INTRAVENOUS

## 2015-02-27 MED ORDER — WARFARIN - PHARMACIST DOSING INPATIENT: Freq: Every day | Status: DC

## 2015-02-27 MED ORDER — HYDROXYZINE HCL 10 MG/5ML PO SYRP
25.0000 mg | ORAL_SOLUTION | Freq: Four times a day (QID) | ORAL | Status: DC | PRN
  Administered 2015-03-01: 25 mg via ORAL
  Filled 2015-02-27 (×4): qty 12.5

## 2015-02-27 MED ORDER — ENSURE COMPLETE PO LIQD
237.0000 mL | Freq: Two times a day (BID) | ORAL | Status: DC
  Administered 2015-02-27 – 2015-03-02 (×6): 237 mL via ORAL

## 2015-02-27 MED ORDER — ALBUTEROL SULFATE (2.5 MG/3ML) 0.083% IN NEBU: 3.0000 mL | INHALATION_SOLUTION | Freq: Four times a day (QID) | RESPIRATORY_TRACT | Status: DC | PRN

## 2015-02-27 MED ORDER — SODIUM CHLORIDE 0.9 % IV BOLUS (SEPSIS)
500.0000 mL | Freq: Once | INTRAVENOUS | Status: AC
  Administered 2015-02-27: 500 mL via INTRAVENOUS

## 2015-02-27 MED ORDER — MORPHINE SULFATE 2 MG/ML IJ SOLN
1.0000 mg | INTRAMUSCULAR | Status: DC | PRN
  Filled 2015-02-27: qty 1

## 2015-02-27 MED ORDER — DILTIAZEM HCL 30 MG PO TABS
30.0000 mg | ORAL_TABLET | Freq: Two times a day (BID) | ORAL | Status: DC
  Administered 2015-02-27 – 2015-03-02 (×7): 30 mg via ORAL
  Filled 2015-02-27 (×8): qty 1

## 2015-02-27 MED ORDER — VITAMIN D3 25 MCG (1000 UNIT) PO TABS
1000.0000 [IU] | ORAL_TABLET | Freq: Every day | ORAL | Status: DC
  Administered 2015-02-27 – 2015-03-02 (×4): 1000 [IU] via ORAL
  Filled 2015-02-27 (×4): qty 1

## 2015-02-27 MED ORDER — SODIUM CHLORIDE 0.9 % IV BOLUS (SEPSIS): 500.0000 mL | Freq: Once | INTRAVENOUS | Status: DC

## 2015-02-27 NOTE — Progress Notes (Signed)
INITIAL NUTRITION ASSESSMENT  DOCUMENTATION CODES Per approved criteria  -Obesity Unspecified   INTERVENTION: Continue Ensure Complete po BID, each supplement provides 350 kcal and 13 grams of protein.  Recommend obtaining new height measurement.  Encourage adequate PO intake.  NUTRITION DIAGNOSIS: Increased nutrient needs related to chronic illness as evidenced by estimated nutrition needs.   Goal: Pt to meet >/= 90% of their estimated nutrition needs   Monitor:  PO intake, weight trends, labs, I/O's  Reason for Assessment: MST  81 y.o. female  Admitting Dx: Nausea vomiting and diarrhea  ASSESSMENT: Pt with PMH of congestive heart failure, atrial fibrillation on Coumadin, coronary artery disease, stroke, hyperlipidemia, rheumatoid arthritis, chronic kidney disease-III, pancytopenia, coronary artery disease, who presents with nausea, vomiting, diarrhea, abdominal pain.  Pt reports having decreased appetite that has been ongoing over the past 2 weeks. Pt does report she still has been consuming 3 meals a day. Unable to obtain nutrition hx as pt was not alert enough to answer most questions asked, however ws able to tell me her height. Current meal completion has been 100%. Pt currently has Ensure ordered. Will continue with current orders. Pt was encouraged to eat her food at meals.   Pt with no observed significant fat or muscle mass loss.  Labs: Low GFR. High creatinine.  Height: Ht Readings from Last 1 Encounters:  No data found for Ht  Pt reports 5 feet 3 inches.  Weight: Wt Readings from Last 1 Encounters:  02/27/15 223 lb 5.2 oz (101.3 kg)    Ideal Body Weight: 115 lbs  % Ideal Body Weight: 194%  Wt Readings from Last 10 Encounters:  02/27/15 223 lb 5.2 oz (101.3 kg)    Usual Body Weight: Unable to obtain  % Usual Body Weight: ---  BMI:  Body Mass Index: 39.57 kg/(m^2) Class II Obesity  Estimated Nutritional Needs: Kcal: 1800-2000 Protein: 100-120  grams Fluid: 1.8 - 2 L/day  Skin: Wound on right toe  Diet Order: Diet Heart  EDUCATION NEEDS: -Education not appropriate at this time   Intake/Output Summary (Last 24 hours) at 02/27/15 1057 Last data filed at 02/27/15 1009  Gross per 24 hour  Intake    240 ml  Output    225 ml  Net     15 ml    Last BM: 2/28  Labs:   Recent Labs Lab 02/26/15 1926 02/27/15 0509  NA 138 141  K 3.6 3.9  CL 105 108  CO2 27 26  BUN 16 14  CREATININE 1.34* 1.29*  CALCIUM 8.7 8.8  GLUCOSE 92 86    CBG (last 3)  No results for input(s): GLUCAP in the last 72 hours.  Scheduled Meds: . antiseptic oral rinse  7 mL Mouth Rinse q12n4p  . aspirin EC  81 mg Oral Daily  . chlorhexidine  15 mL Mouth Rinse BID  . cholecalciferol  1,000 Units Oral Daily  . diltiazem  30 mg Oral BID  . feeding supplement (ENSURE COMPLETE)  237 mL Oral BID BM  . pantoprazole  40 mg Oral Q1200  . pravastatin  20 mg Oral QHS  . sodium chloride  3 mL Intravenous Q12H  . traMADol  50 mg Oral BID  . Warfarin - Pharmacist Dosing Inpatient   Does not apply q1800    Continuous Infusions: . sodium chloride 75 mL/hr at 02/27/15 6256    Past Medical History  Diagnosis Date  . Asthma   . Arthritis   . CHF (congestive  heart failure)   . Thyroid disease   . Stroke   . Coronary artery disease   . Hypoglycemia   . Hypertension   . Renal disorder   . Atrial fibrillation     Past Surgical History  Procedure Laterality Date  . Joint replacement      bilateral knee and RT hip  . Pacemaker placement      Marijean Niemann, MS, RD, LDN Pager # [phone] After hours/ weekend pager # [phone]

## 2015-02-27 NOTE — Progress Notes (Signed)
ANTICOAGULATION CONSULT NOTE - Follow-Up Consult  Pharmacy Consult for Coumadin Indication: atrial fibrillation  Allergies  Allergen Reactions  . Gabapentin Itching  . Penicillins Itching  . Sulfa Antibiotics Itching  . Hydrocodone-Acetaminophen Itching    Vital Signs: Temp: 97.9 F (36.6 C) (02/29 0627) Temp Source: Oral (02/29 0627) BP: 119/66 mmHg (02/29 0627) Pulse Rate: 65 (02/29 0627)  Labs:  Recent Labs  02/26/15 1926 02/27/15 0509  HGB 8.9* 8.6*  HCT 26.3* 24.9*  PLT 133* 124*  LABPROT 33.8* 32.1*  INR 3.30* 3.09*  CREATININE 1.34* 1.29*  TROPONINI  --  <0.03     Medical History: Past Medical History  Diagnosis Date  . Asthma   . Arthritis   . CHF (congestive heart failure)   . Thyroid disease   . Stroke   . Coronary artery disease   . Hypoglycemia   . Hypertension   . Renal disorder   . Atrial fibrillation      Assessment: 81yo female c/o N/V/D w/ abdominal pain, admitted for further evaluation, to continue Coumadin; of note INR is slightly above goal today, FOBT positive but no gross bleeding from rectum, last dose of Coumadin PTA was 2/28.  PTA Coumadin dose 2 mg daily except 1 mg on Wed, Fri  Goal of Therapy:  INR 2-3   Plan:  1. No Coumadin today. 2. Daily PT/INR.  Tad Moore, BCPS  Clinical Pharmacist Pager [phone]  02/27/2015 8:47 AM

## 2015-02-27 NOTE — Evaluation (Signed)
Clinical/Bedside Swallow Evaluation Patient Details  Name: Patricia Klein MRN: [id number] Date of Birth: Dec 29, 1928  Today's Date: 02/27/2015 Time: SLP Start Time (ACUTE ONLY): 1530 SLP Stop Time (ACUTE ONLY): 1545 SLP Time Calculation (min) (ACUTE ONLY): 15 min  Past Medical History:  Past Medical History  Diagnosis Date  . Asthma   . Arthritis   . CHF (congestive heart failure)   . Thyroid disease   . Stroke   . Coronary artery disease   . Hypoglycemia   . Hypertension   . Renal disorder   . Atrial fibrillation    Past Surgical History:  Past Surgical History  Procedure Laterality Date  . Joint replacement      bilateral knee and RT hip  . Pacemaker placement     HPI:  Patricia Klein is a 81 y.o. female with PMH of congestive heart failure, atrial fibrillation on Coumadin, coronary artery disease, stroke, hyperlipidemia, rheumatoid arthritis, chronic kidney disease-III, pancytopenia, coronary artery disease, who presents with nausea, vomiting, diarrhea, abdominal pain.  Patient reports that has nausea, vomiting, diarrhea in the past 2 weeks. She had 4 bowel movements with watery stool today. She was recently treated with abx for UTI in [facility]. Patient states she has epigastric/chest pain that is worse with eating and is burning. Pain is constant. No shortness of breath. She also complains of leg edema due to CHF and venous insufficiency. She reports that she is compliant to her diuretics at home. She was recently seen by her primary care provider and was diagnosed with thrombocytopenia and possible gastritis. Patient with GI follow-up March 8. She was given referral to hematology. She has stopped taking metoprolol. Patient denies fever, chills, dysuria, urgency, frequency, hematuria, skin rashes, No unilateral weakness, numbness or tingling sensations. No vision change or hearing loss.   Assessment / Plan / Recommendation Clinical Impression  The patient was  seen for a clinical evaluation of swallowing.  Pt presents with oral dysphagia characterized by difficulty masticating most likely due to poor dentition.  Pharyngeal phase appears functional.  Patient with complaints that are most consistent with esophageal dysphagia.  The patient complains of material sticking about mid chest and that she has had to regurgitate food.  She reports that this is improving.  Due to the patients issues masticating recommend a D2 diet with regular liquids.  The patient was encouraged to drink plenty of fluids with her meals to facilitate esophageal clearance.  ST will follow up x1 for therapeutic diet tolerance.      Aspiration Risk  Mild    Diet Recommendation Dysphagia 2 (Fine chop);Thin liquid   Liquid Administration via: Spoon;Cup Medication Administration: Crushed with puree Supervision: Patient able to self feed Compensations: Slow rate;Small sips/bites;Follow solids with liquid Postural Changes and/or Swallow Maneuvers: Seated upright 90 degrees;Upright 30-60 min after meal    Other  Recommendations Oral Care Recommendations: Oral care BID   Follow Up Recommendations       Frequency and Duration min 1 x/week  2 weeks     Swallow Study Prior Functional Status       General Date of Onset: 02/26/15 HPI: Patricia Klein is a 81 y.o. female with PMH of congestive heart failure, atrial fibrillation on Coumadin, coronary artery disease, stroke, hyperlipidemia, rheumatoid arthritis, chronic kidney disease-III, pancytopenia, coronary artery disease, who presents with nausea, vomiting, diarrhea, abdominal pain.  Patient reports that has nausea, vomiting, diarrhea in the past 2 weeks. She had 4 bowel movements with  watery stool today. She was recently treated with abx for UTI in [facility]. Patient states she has epigastric/chest pain that is worse with eating and is burning. Pain is constant. No shortness of breath. She also complains of leg edema  due to CHF and venous insufficiency. She reports that she is compliant to her diuretics at home. She was recently seen by her primary care provider and was diagnosed with thrombocytopenia and possible gastritis. Patient with GI follow-up March 8. She was given referral to hematology. She has stopped taking metoprolol. Patient denies fever, chills, dysuria, urgency, frequency, hematuria, skin rashes, No unilateral weakness, numbness or tingling sensations. No vision change or hearing loss. Type of Study: Bedside swallow evaluation Previous Swallow Assessment: NA Diet Prior to this Study: Regular;Thin liquids Temperature Spikes Noted: No Respiratory Status: Room air History of Recent Intubation: No Behavior/Cognition: Alert;Cooperative;Pleasant mood Oral Cavity - Dentition: Poor condition;Missing dentition Self-Feeding Abilities: Able to feed self Patient Positioning: Upright in bed Baseline Vocal Quality: Clear Volitional Cough: Strong Volitional Swallow: Able to elicit    Oral/Motor/Sensory Function Overall Oral Motor/Sensory Function: Appears within functional limits for tasks assessed Labial ROM: Within Functional Limits Labial Symmetry: Within Functional Limits Labial Strength: Within Functional Limits Lingual ROM: Within Functional Limits Lingual Symmetry: Within Functional Limits Lingual Strength: Within Functional Limits Facial ROM: Within Functional Limits Facial Symmetry: Within Functional Limits Facial Strength: Within Functional Limits Mandible: Within Functional Limits   Ice Chips Ice chips: Not tested   Thin Liquid Thin Liquid: Within functional limits Presentation: Cup;Self Fed    Nectar Thick Nectar Thick Liquid: Not tested   Honey Thick Honey Thick Liquid: Not tested   Puree Puree: Within functional limits Presentation: Spoon   Solid   GO    Solid: Impaired Presentation: Spoon Oral Phase Impairments: Impaired mastication       Patricia Klein  N 02/27/2015,3:55 PM  Patricia Aguas, MA, CCC-SLP Acute Rehab SLP [phone]

## 2015-02-27 NOTE — Progress Notes (Signed)
Admission note:  Arrival Method: Arrived on stretcher from ED  Mental Orientation: Alert and oriented x 4 Telemetry: Telemetry box 3 applied, CCMD notified Assessment: Completed, see flowsheets Skin: Moisture associated skin breakdown noted in folds of pts abdomen on left side and on buttock. Closed wound noted to pts great toe on right foot. Foam dressings applied.  IV: Right wrist IV with NS running at 47ml/hr Pain: Pt states no pain at this time Tubes: IV tubing and SCD tubing secured Safety Measures: High fall risk Fall Prevention Safety Plan: Reviewed with patient Admission Screening: Completed 6700 Orientation: Patient has been oriented to the unit, staff and to the room. Patient lying comfortably in bed with no needs stated at this time. Call light within reach, will continue to monitor.   Feliciana Rossetti, RN

## 2015-02-27 NOTE — ED Notes (Signed)
Daughter Turner, [phone]

## 2015-02-27 NOTE — Progress Notes (Signed)
ANTICOAGULATION CONSULT NOTE - Initial Consult  Pharmacy Consult for Coumadin Indication: atrial fibrillation  Allergies  Allergen Reactions  . Gabapentin Itching  . Penicillins Itching  . Sulfa Antibiotics Itching  . Hydrocodone-Acetaminophen Itching    Vital Signs: Temp: 98.6 F (37 C) (02/29 0113) Temp Source: Oral (02/29 0113) BP: 109/34 mmHg (02/29 0200) Pulse Rate: 81 (02/29 0200)  Labs:  Recent Labs  02/26/15 1926  HGB 8.9*  HCT 26.3*  PLT 133*  LABPROT 33.8*  INR 3.30*  CREATININE 1.34*     Medical History: Past Medical History  Diagnosis Date  . Asthma   . Arthritis   . CHF (congestive heart failure)   . Thyroid disease   . Stroke   . Coronary artery disease   . Hypoglycemia   . Hypertension   . Renal disorder   . Atrial fibrillation      Assessment: 81yo female c/o N/V/D w/ abdominal pain, admitted for further evaluation, to continue Coumadin; of note INR is slightly above goal, FOBT positive but no gross bleeding from rectum, last dose of Coumadin PTA was 2/28.  Goal of Therapy:  INR 2-3   Plan:  Will hold Coumadin for now and monitor INR for dose adjustments.  Vernard Gambles, PharmD, BCPS  02/27/2015,2:43 AM

## 2015-02-27 NOTE — H&P (Addendum)
[facility] History and Physical  Patricia Klein FYB:[id number] DOB: 1928/09/02 DOA: 02/26/2015  Referring physician: ED physician PCP: No primary care provider on file.  Specialists:   Chief Complaint: Nausea, vomiting, diarrhea, abdominal pain  HPI: Patricia Klein is a 81 y.o. female with PMH of congestive heart failure, atrial fibrillation on Coumadin, coronary artery disease, stroke, hyperlipidemia, rheumatoid arthritis, chronic kidney disease-III, pancytopenia, coronary artery disease, who presents with nausea, vomiting, diarrhea, abdominal pain.  Patient reports that has nausea, vomiting, diarrhea in the past 2 weeks. She had 4 bowel movements with watery stool today. She was recently treated with abx for UTI in [facility]. Patient states she has epigastric/chest pain that is worse with eating and is burning. Pain is constant. No shortness of breath. She also complains of leg edema due to CHF and venous insufficiency. She reports that she is compliant to her diuretics at home. She was recently seen by her primary care provider and was diagnosed with thrombocytopenia and possible gastritis. Patient with GI follow-up March 8. She was given referral to hematology. She has stopped taking metoprolol. Patient denies fever, chills, dysuria, urgency, frequency, hematuria, skin rashes, No unilateral weakness, numbness or tingling sensations. No vision change or hearing loss.  In ED, patient was found to have pancytopenia with stable hemoglobin, WBC 1.8, platelets 133, hemoglobin 8.9, negative troponin, active chest x-ray, temperature normal, blood pressure is soft, and INR 3.3. Urinalysis is dirty catch with many squamous cells. She is admitted to the inpatient further evaluation and treatment.  Review of Systems: As presented in the history of presenting illness, rest negative.  Where does patient live?  At home Can patient participate in ADLs? Yes  Allergy:  Allergies   Allergen Reactions  . Gabapentin Itching  . Penicillins Itching  . Sulfa Antibiotics Itching  . Hydrocodone-Acetaminophen Itching    Past Medical History  Diagnosis Date  . Asthma   . Arthritis   . CHF (congestive heart failure)   . Thyroid disease   . Stroke   . Coronary artery disease   . Hypoglycemia   . Hypertension   . Renal disorder   . Atrial fibrillation     Past Surgical History  Procedure Laterality Date  . Joint replacement      bilateral knee and RT hip  . Pacemaker placement      Social History:  reports that she has never smoked. She does not have any smokeless tobacco history on file. She reports that she does not drink alcohol. Her drug history is not on file.  Family History:  Family History  Problem Relation Age of Onset  . Heart attack Mother   . Heart attack Father   . Heart attack Brother      Prior to Admission medications   Medication Sig Start Date End Date Taking? Authorizing Provider  albuterol (PROVENTIL HFA;VENTOLIN HFA) 108 (90 BASE) MCG/ACT inhaler Inhale 2 puffs into the lungs every 6 (six) hours as needed for wheezing or shortness of breath.    Yes Historical Provider, MD  aspirin EC 81 MG tablet Take 81 mg by mouth daily.   Yes Historical Provider, MD  cholecalciferol (VITAMIN D) 1000 UNITS tablet Take 1,000 Units by mouth daily.   Yes Historical Provider, MD  diltiazem (CARDIZEM) 30 MG tablet Take 30 mg by mouth 2 (two) times daily. 12/28/14  Yes Historical Provider, MD  furosemide (LASIX) 40 MG tablet Take 40 mg by mouth 2 (two) times daily. 12/28/14  Yes Historical Provider, MD  metoprolol (LOPRESSOR) 100 MG tablet Take 100 mg by mouth 2 (two) times daily.  02/02/15  Yes Historical Provider, MD  nitroGLYCERIN (NITROSTAT) 0.4 MG SL tablet Place 0.4 mg under the tongue every 5 (five) minutes as needed for chest pain.    Yes Historical Provider, MD  nystatin (MYCOSTATIN/NYSTOP) 100000 UNIT/GM POWD Apply topically 3 (three) times daily as  needed (irritation under breasts, under stomach folds and vaginal area).  01/25/15  Yes Historical Provider, MD  oxyCODONE-acetaminophen (PERCOCET/ROXICET) 5-325 MG per tablet Take 1 tablet by mouth every 6 (six) hours as needed (pain).  02/17/15  Yes Historical Provider, MD  potassium chloride SA (K-DUR,KLOR-CON) 20 MEQ tablet Take 20 mEq by mouth daily.  02/22/15  Yes Historical Provider, MD  pravastatin (PRAVACHOL) 20 MG tablet Take 20 mg by mouth at bedtime.  02/23/15  Yes Historical Provider, MD  sennosides-docusate sodium (SENOKOT-S) 8.6-50 MG tablet Take 1 tablet by mouth daily as needed.    Yes Historical Provider, MD  traMADol (ULTRAM) 50 MG tablet Take 50 mg by mouth 2 (two) times daily. 02/17/15  Yes Historical Provider, MD  warfarin (COUMADIN) 2 MG tablet Take 1-2 mg by mouth daily after supper. Take 1/2 tablet (1 mg) on Wednesday and Friday at 5pm; take 1 tablet (2 mg) on Sunday, Monday, Tuesday, Thursday, Saturday at 5pm 02/07/15  Yes Historical Provider, MD  methotrexate (RHEUMATREX) 2.5 MG tablet 15 mg once a week. On Mondays 02/07/15   Historical Provider, MD    Physical Exam: Filed Vitals:   02/27/15 0030 02/27/15 0045 02/27/15 0100 02/27/15 0113  BP: 100/51  90/38 94/39  Pulse: 78 67 86 74  Temp:    98.6 F (37 C)  TempSrc:    Oral  Resp: 17 17 19 18   SpO2: 98% 100% 94% 97%   General: Not in acute distress HEENT:       Eyes: PERRL, EOMI, no scleral icterus       ENT: No discharge from the ears and nose, no pharynx injection, no tonsillar enlargement.        Neck: No JVD, no bruit, no mass felt. Cardiac: S1/S2, RRR, No murmurs, No gallops or rubs Pulm: Good air movement bilaterally. Clear to auscultation bilaterally. No rales, wheezing, rhonchi or rubs. Abd: Soft, nondistended, mildly diffused tenderness, no rebound pain, no organomegaly, BS present Ext: 1+ leg edema bilaterally. 2+DP/PT pulse bilaterally Musculoskeletal: No joint deformities, erythema, or stiffness, ROM  full Skin: No rashes.  Neuro: Alert and oriented X3, cranial nerves II-XII grossly intact, muscle strength 5/5 in all extremeties, sensation to light touch intact.  Psych: Patient is not psychotic, no suicidal or hemocidal ideation.  Labs on Admission:  Basic Metabolic Panel:  Recent Labs Lab 02/26/15 1926  NA 138  K 3.6  CL 105  CO2 27  GLUCOSE 92  BUN 16  CREATININE 1.34*  CALCIUM 8.7   Liver Function Tests:  Recent Labs Lab 02/26/15 1926  AST 22  ALT 13  ALKPHOS 79  BILITOT 0.9  PROT 6.7  ALBUMIN 3.1*    Recent Labs Lab 02/26/15 1926  LIPASE 33   No results for input(s): AMMONIA in the last 168 hours. CBC:  Recent Labs Lab 02/26/15 1926  WBC 1.8*  NEUTROABS 0.5*  HGB 8.9*  HCT 26.3*  MCV 88.6  PLT 133*   Cardiac Enzymes: No results for input(s): CKTOTAL, CKMB, CKMBINDEX, TROPONINI in the last 168 hours.  BNP (last 3 results) No results  for input(s): BNP in the last 8760 hours.  ProBNP (last 3 results) No results for input(s): PROBNP in the last 8760 hours.  CBG: No results for input(s): GLUCAP in the last 168 hours.  Radiological Exams on Admission: Dg Chest 2 View  02/26/2015   CLINICAL DATA:  Chest pain and weakness for 2 days  EXAM: CHEST  2 VIEW  COMPARISON:  02/14/2015  FINDINGS: Cardiac shadow remains enlarged. A pacing device is again seen. Degenerative changes of the shoulder joints are again noted bilaterally. No focal infiltrate or sizable effusion is seen.  IMPRESSION: No acute abnormality noted.   Electronically Signed   By: Alcide Clever M.D.   On: 02/26/2015 20:06    EKG: Independently reviewed. A. fib, QTC 482  Assessment/Plan Principal Problem:   Nausea vomiting and diarrhea Active Problems:   HYPERTENSION, BENIGN SYSTEMIC   Atrial fibrillation   Asthma   CHF (congestive heart failure)   Hypertension   CAD (coronary artery disease)   CKD (chronic kidney disease), stage III   Chest pain   RA (rheumatoid arthritis)    Pancytopenia   Nausea, vomiting, diarrhea and abdominal pain: Etiology is not clear. Patient had recent antibiotics use for UTI, therefore is important to rule out C. Difficile colitis. Patient is not obviously septic, but her blood pressure is soft, which is likely due to dehydration. -will admit to tele bed -check GI pathogen panel and C. difficile PCR -follow up blood culture -repeat UA, get Urine culture -IV fluid: received 1 liter of normal saline bolus in emergency room, followed by 50 mL per hour. Need to be careful with IVF since she has CHF (not sure about her EF). -Hydroxyzine for nausea (QTC 482, not good candidate for Zofran) -Check lactic acid level  Chest pain: Described as burning pain, worsening with eating food. Likely due to acid reflux or gastritis. -will start Protonix -trop x 3  Atrial Fibrillation: CHA2DS2-VASc Score 7, need oral anticoagulation. Patient is on coumadin at home. INR is  3.30 on admission. Heart rate is well controlled with Cardizem. Patient reports that she stopped taking metoprolol. -continue Coumadin per pharmacy (Patient has positive FOBT, but no gross rectal bleeding. Hemoglobin stable, will continue coumadine) -Continue Cardizem  Congestive heart failure: Not sure which type of CHF, since no EF on record. Does not seem to have acute exacerbation, though patient has 1+ leg edema which is likely due to combination of chronic venous insufficiency and congestive heart failure. She had lower extremity Doppler 02/14/15 in [facility], which was negative for DVT. Her blood pressure is soft on admission due to dehydration. -Hold Lasix -Check BNP -Get medical record from [facility]  CKD-III: Cre stable. Baseline creatinine 1.32 8, HER CREATININE IS 1.34 ON ADMISSION. -FOLLOW-UP BMP  Pancytopenia: unclear etiology. May be related to methotrexate use. He was recently evaluated by her PCP, who gave her referral to  hematology, was not seen yet. Patient has positive FOBT, but no gross rectal bleeding. Hemoglobin stable, hemoglobin was 9.3 years ago, her hemoglobin is 8.9, admission today -Follow-up with hematology as outpatient -Hold methotrexate -CBC in AM  Hypertension:  -on Cardizem  Asthma: Stable. -Continue albuterol inhaler when necessary  Rheumatoid arthritis: Stable. Patient used to be on methotrexate, which is on hold per PCP likely due to pancytopenia. -Observe patient closely for new symptoms.   DVT ppx: on Coumadin with INR=3.30  Code Status: Full code Family Communication: None at bed side.  Disposition Plan: Admit to inpatient   Date of Service 02/27/2015    Lorretta Harp [facility] Pager [phone]  If 7PM-7AM, please contact night-coverage [email or web address] Password [email or web address] 02/27/2015, 1:49 AM

## 2015-02-27 NOTE — Progress Notes (Signed)
  Echocardiogram 2D Echocardiogram has been performed.  Cathie Beams 02/27/2015, 2:43 PM

## 2015-02-27 NOTE — Evaluation (Signed)
Physical Therapy Evaluation Patient Details Name: Patricia Klein MRN: [id number] DOB: Dec 23, 1928 Today's Date: 02/27/2015   History of Present Illness  Pt is an 81 y.o. female with PMH of congestive heart failure, atrial fibrillation, CAD, CVA, hyperlipidemia, RA, CKD-III, pancytopenia. Pt presents with nausea, vomiting, diarrhea, abdominal pain x2 weeks. She was recently treated with abx for UTI in [facility]. Patient states she has epigastric/chest pain that is worse with eating and is burning. She also complains of LEE due to CHF and venous insufficiency. She reports that she is compliant to her diuretics at home. She was recently seen by her primary care provider and was diagnosed with thrombocytopenia and possible gastritis.  Clinical Impression  Pt admitted with above diagnosis. Pt currently with functional limitations due to the deficits listed below (see PT Problem List). At the time of PT eval pt was able to perform transfers and ambulation at the min assist level. Pt appears to have needed some assist at home PTA, however unsure if pt will have 24 hour assist at d/c.   Pt will benefit from skilled PT to increase their independence and safety with mobility to allow discharge to the venue listed below. Discussed the benefits of SNF with pt, who states that she will discuss with daughter before agreeing to rehab.       Follow Up Recommendations SNF;Supervision/Assistance - 24 hour    Equipment Recommendations  Rolling walker with 5" wheels;3in1 (PT)    Recommendations for Other Services       Precautions / Restrictions Precautions Precautions: Fall Restrictions Weight Bearing Restrictions: No      Mobility  Bed Mobility Overal bed mobility: Needs Assistance Bed Mobility: Supine to Sit     Supine to sit: Min assist     General bed mobility comments: Pt was able to transition to EOB with increased time and heavy use of bedrails for support. Pt required  assist to gain and maintain balance EOB.   Transfers Overall transfer level: Needs assistance Equipment used: Rolling walker (2 wheeled) Transfers: Sit to/from Stand Sit to Stand: Min assist;+2 physical assistance         General transfer comment: Pt required bed to be raised fairly high for her height for assist to stand. Pt states this is how she gets out of the bed at home (has an adjustable bed), however demonstrated no power-up to stand due to bed height.   Ambulation/Gait Ambulation/Gait assistance: Min assist Ambulation Distance (Feet): 20 Feet Assistive device: Rolling walker (2 wheeled) Gait Pattern/deviations: Step-through pattern;Decreased stride length;Trunk flexed Gait velocity: Decreased Gait velocity interpretation: Below normal speed for age/gender General Gait Details: Pt was able to ambulate ~20 feet with chair follow for safety. Slow and guarded gait. Assist was required for walker direction at times.   Stairs            Wheelchair Mobility    Modified Rankin (Stroke Patients Only)       Balance Overall balance assessment: Needs assistance Sitting-balance support: Feet supported;No upper extremity supported Sitting balance-Leahy Scale: Fair     Standing balance support: Bilateral upper extremity supported Standing balance-Leahy Scale: Poor Standing balance comment: Pt requires UE support to maintain standing balance.                              Pertinent Vitals/Pain Pain Assessment: No/denies pain    Home Living Family/patient expects to be discharged to:: Private residence  Living Arrangements: Children Available Help at Discharge: Family;Available PRN/intermittently Type of Home: House Home Access: Stairs to enter   [employer] of Steps: 1 Home Layout: One level Home Equipment: Cane - single point Additional Comments: 2 steps within home to get to [employer] or [employer]     Prior Function Level of Independence: Needs  assistance   Gait / Transfers Assistance Needed: Uses cane and furniture walks all the time   ADL's / Homemaking Assistance Needed: Children assist with all bathing and dressing        Hand Dominance   Dominant Hand: Right    Extremity/Trunk Assessment   Upper Extremity Assessment: Defer to OT evaluation           Lower Extremity Assessment: Generalized weakness      Cervical / Trunk Assessment: Kyphotic  Communication   Communication: No difficulties  Cognition Arousal/Alertness: Awake/alert Behavior During Therapy: WFL for tasks assessed/performed Overall Cognitive Status: No family/caregiver present to determine baseline cognitive functioning                      General Comments      Exercises        Assessment/Plan    PT Assessment Patient needs continued PT services  PT Diagnosis Difficulty walking;Generalized weakness   PT Problem List Decreased strength;Decreased range of motion;Decreased activity tolerance;Decreased balance;Decreased mobility;Decreased knowledge of use of DME;Decreased safety awareness;Decreased knowledge of precautions  PT Treatment Interventions DME instruction;Gait training;Stair training;Functional mobility training;Therapeutic activities;Therapeutic exercise;Neuromuscular re-education;Patient/family education   PT Goals (Current goals can be found in the Care Plan section) Acute Rehab PT Goals Patient Stated Goal: Get stronger PT Goal Formulation: With patient Time For Goal Achievement: 03/13/15 Potential to Achieve Goals: Good    Frequency Min 3X/week   Barriers to discharge        Co-evaluation               End of Session Equipment Utilized During Treatment: Gait belt Activity Tolerance: Patient tolerated treatment well Patient left: in chair;with call bell/phone within reach;Other (comment) (Chair alarm pad in place - NT to get box and activate alarm) Nurse Communication: Mobility status          Time: 1497-0263 PT Time Calculation (min) (ACUTE ONLY): 20 min   Charges:   PT Evaluation $Initial PT Evaluation Tier I: 1 Procedure     PT G Codes:        Conni Slipper 03/18/2015, 11:41 AM   Conni Slipper, PT, DPT Acute Rehabilitation Services Pager: [phone]

## 2015-02-27 NOTE — Progress Notes (Signed)
Patient admitted early morning due to dysphagia, generalized weakness and 2 episodes of loose stool. Feeling much better. No signs of sepsis or serious infection. Rule out C. difficile, GI pathogen panel pending. Will request speech to evaluate for dysphagia. Likely generalized weakness due to underlying poor intake from dysphagia. PT to evaluate as well. We will monitor electrolytes and CBC.

## 2015-02-28 ENCOUNTER — Encounter (HOSPITAL_COMMUNITY): Payer: Self-pay | Admitting: *Deleted

## 2015-02-28 DIAGNOSIS — R1314 Dysphagia, pharyngoesophageal phase: Secondary | ICD-10-CM

## 2015-02-28 LAB — CBC
HCT: 28.8 % — ABNORMAL LOW (ref 36.0–46.0)
HEMOGLOBIN: 9.6 g/dL — AB (ref 12.0–15.0)
MCH: 30.5 pg (ref 26.0–34.0)
MCHC: 33.3 g/dL (ref 30.0–36.0)
MCV: 91.4 fL (ref 78.0–100.0)
Platelets: 126 10*3/uL — ABNORMAL LOW (ref 150–400)
RBC: 3.15 MIL/uL — ABNORMAL LOW (ref 3.87–5.11)
RDW: 21.8 % — AB (ref 11.5–15.5)
WBC: 2.5 10*3/uL — ABNORMAL LOW (ref 4.0–10.5)

## 2015-02-28 LAB — BASIC METABOLIC PANEL
Anion gap: 6 (ref 5–15)
BUN: 9 mg/dL (ref 6–23)
CHLORIDE: 111 mmol/L (ref 96–112)
CO2: 23 mmol/L (ref 19–32)
Calcium: 8.8 mg/dL (ref 8.4–10.5)
Creatinine, Ser: 1.13 mg/dL — ABNORMAL HIGH (ref 0.50–1.10)
GFR calc non Af Amer: 43 mL/min — ABNORMAL LOW (ref >90)
GFR, EST AFRICAN AMERICAN: 50 mL/min — AB (ref >90)
Glucose, Bld: 104 mg/dL — ABNORMAL HIGH (ref 70–99)
Potassium: 4.1 mmol/L (ref 3.5–5.1)
Sodium: 140 mmol/L (ref 135–145)

## 2015-02-28 LAB — PROTIME-INR
INR: 2.84 — AB (ref 0.00–1.49)
Prothrombin Time: 30.1 seconds — ABNORMAL HIGH (ref 11.6–15.2)

## 2015-02-28 LAB — CLOSTRIDIUM DIFFICILE BY PCR: CDIFFPCR: NEGATIVE

## 2015-02-28 MED ORDER — WARFARIN SODIUM 2 MG PO TABS
2.0000 mg | ORAL_TABLET | Freq: Once | ORAL | Status: AC
  Administered 2015-02-28: 2 mg via ORAL
  Filled 2015-02-28: qty 1

## 2015-02-28 MED ORDER — WHITE PETROLATUM GEL
Status: AC
  Administered 2015-02-28: 12:00:00
  Filled 2015-02-28: qty 1

## 2015-02-28 MED ORDER — SODIUM CHLORIDE 0.9 % IV SOLN
INTRAVENOUS | Status: AC
  Administered 2015-02-28 (×2): via INTRAVENOUS

## 2015-02-28 MED ORDER — WHITE PETROLATUM GEL
Status: AC
  Administered 2015-02-28: 0.2
  Filled 2015-02-28: qty 1

## 2015-02-28 NOTE — Progress Notes (Signed)
Patient Demographics  Patricia Klein, is a 81 y.o. female, DOB - 01-21-1928, GZF:[id number]  Admit date - 02/26/2015   Admitting Physician Lorretta Harp, MD  Outpatient Primary MD for the patient is No primary care provider on file.  LOS - 1   No chief complaint on file.       Subjective:   [employer] today has, No headache, No chest pain, No abdominal pain - No Nausea, No new weakness tingling or numbness, No Cough - SOB.  Her swallowing has improved.  Assessment & Plan    1. Dysphagia/odynophagia causing poor oral intake and generalized weakness. Seen by speech, currently on dysphagia 2 diet, on PPI which be continued. Have requested GI to evaluate as she might require EGD/barium swallow. Her primary gastroenterologist is in [facility]. [facility] has been consulted. ID gentle hydration and PTT today.   2. Viral gastroenteritis. Completely resolved. No BM in over 24 hours.   3. Atypical chest pain. Secondary to odynophagia/is a vaginitis, EKG unremarkable, currently chest pain-free, troponin negative 3. Continue combination of aspirin statin and Coumadin for secondary prevention.   4. Chronic Atrial Fibrillation: CHA2DS2-VASc Score 7 - telemetry monitoring, on Cardizem Coumadin continue. No acute issues. Pharmacy monitoring Coumadin dose.   5. Chr diastolic dysfunction EF 55-60% this admission. Compensated no acute issues.   6. History of asthma. Supportive care and no wheezing or shortness of breath.   7. History of rheumatoid arthritis. On methotrexate which is being held as she developed pancytopenia upon admission. Outpatient follow-up with her rheumatologist.   8. Pancytopenia. Could be secondary to methotrexate which is on hold, blood counts improving, will have her follow with  her primary rheumatologist postdischarge.   9. ARF on chronic kidney disease stage III. Baseline creatinine 1.3.  Was mildly dehydrated, After hydration she is at baseline.     Code Status: Full  Family Communication: None  Disposition Plan: TBD   Procedures    TTE  The patient was in atrial fibrillation. Normal LV size with mild LV hypertrophy. EF 55-60%. Mildly dilated RV with mildly decreased systolic function. Moderate pulmonary hypertension. Mild to moderate TR.   Consults  GI   Medications  Scheduled Meds: . antiseptic oral rinse  7 mL Mouth Rinse q12n4p  . aspirin EC  81 mg Oral Daily  . chlorhexidine  15 mL Mouth Rinse BID  . cholecalciferol  1,000 Units Oral Daily  . diltiazem  30 mg Oral BID  . feeding supplement (ENSURE COMPLETE)  237 mL Oral BID BM  . pantoprazole  40 mg Oral Q1200  . pravastatin  20 mg Oral QHS  . sodium chloride  3 mL Intravenous Q12H  . traMADol  50 mg Oral BID  . Warfarin - Pharmacist Dosing Inpatient   Does not apply q1800  . white petrolatum       Continuous Infusions: . sodium chloride 75 mL/hr at 02/28/15 0627   PRN Meds:.albuterol, hydrOXYzine, morphine injection, nitroGLYCERIN, nystatin  DVT Prophylaxis  Couamdin  Lab Results  Component Value Date   INR 2.84* 02/28/2015   INR 3.09* 02/27/2015   INR 3.30* 02/26/2015     Lab Results  Component Value Date   PLT 126* 02/28/2015    Antibiotics    Anti-infectives    None          Objective:   Filed Vitals:   02/27/15 2048 02/28/15 0059 02/28/15 0520 02/28/15 0940  BP: 120/47 114/53 117/41 113/56  Pulse: 66 62 67 67  Temp: 98.3 F (36.8 C) 98 F (36.7 C) 98.2 F (36.8 C) 97.9 F (36.6 C)  TempSrc: Oral Oral Oral Oral  Resp: 18 17 18 18   Weight: 101.833 kg (224 lb 8 oz)     SpO2: 100% 100% 100% 92%    Wt Readings from Last 3 Encounters:  02/27/15 101.833 kg (224 lb 8 oz)     Intake/Output Summary (Last 24 hours) at 02/28/15 1131 Last data  filed at 02/28/15 0941  Gross per 24 hour  Intake 2543.75 ml  Output      1 ml  Net 2542.75 ml     Physical Exam  Awake Alert, Oriented X 3, No new F.N deficits, Normal affect [place].AT,PERRAL Supple Neck,No JVD, No cervical lymphadenopathy appriciated.  Symmetrical Chest wall movement, Good air movement bilaterally, CTAB RRR,No Gallops,Rubs or new Murmurs, No Parasternal Heave +ve B.Sounds, Abd Soft, No tenderness, No organomegaly appriciated, No rebound - guarding or rigidity. No Cyanosis, Clubbing or edema, No new Rash or bruise      Data Review   Micro Results Recent Results (from the past 240 hour(s))  Culture, blood (routine x 2)     Status: None (Preliminary result)   Collection Time: 02/27/15  5:09 AM  Result Value Ref Range Status   Specimen Description BLOOD LEFT HAND  Final   Special Requests BOTTLES DRAWN AEROBIC ONLY 2CC  Final   Culture   Final           BLOOD CULTURE RECEIVED NO GROWTH TO DATE CULTURE WILL BE HELD FOR 5 DAYS BEFORE ISSUING A FINAL NEGATIVE REPORT Performed at 03/01/15    Report Status PENDING  Incomplete  Culture, blood (routine x 2)     Status: None (Preliminary result)   Collection Time: 02/27/15  5:21 AM  Result Value Ref Range Status   Specimen Description BLOOD LEFT FOREARM  Final   Special Requests BOTTLES DRAWN AEROBIC AND ANAEROBIC 10CC EACH  Final   Culture   Final           BLOOD CULTURE RECEIVED NO GROWTH TO DATE CULTURE WILL BE HELD FOR 5 DAYS BEFORE ISSUING A FINAL NEGATIVE REPORT Performed at 03/01/15    Report Status PENDING  Incomplete  Clostridium Difficile by PCR     Status: None   Collection Time: 02/28/15  9:37 AM  Result Value Ref  Range Status   C difficile by pcr NEGATIVE NEGATIVE Final    Radiology Reports Dg Chest 2 View  02/26/2015   CLINICAL DATA:  Chest pain and weakness for 2 days  EXAM: CHEST  2 VIEW  COMPARISON:  02/14/2015  FINDINGS: Cardiac shadow remains enlarged. A pacing device is  again seen. Degenerative changes of the shoulder joints are again noted bilaterally. No focal infiltrate or sizable effusion is seen.  IMPRESSION: No acute abnormality noted.   Electronically Signed   By: Alcide Clever M.D.   On: 02/26/2015 20:06     CBC  Recent Labs Lab 02/26/15 1926 02/27/15 0509 02/28/15 0900  WBC 1.8* 1.7* 2.5*  HGB 8.9* 8.6* 9.6*  HCT 26.3* 24.9* 28.8*  PLT 133* 124* 126*  MCV 88.6 89.2 91.4  MCH 30.0 30.8 30.5  MCHC 33.8 34.5 33.3  RDW 20.4* 20.8* 21.8*  LYMPHSABS 1.0  --   --   MONOABS 0.2  --   --   EOSABS 0.1  --   --   BASOSABS 0.0  --   --     Chemistries   Recent Labs Lab 02/26/15 1926 02/27/15 0509 02/28/15 0900  NA 138 141 140  K 3.6 3.9 4.1  CL 105 108 111  CO2 27 26 23   GLUCOSE 92 86 104*  BUN 16 14 9   CREATININE 1.34* 1.29* 1.13*  CALCIUM 8.7 8.8 8.8  AST 22 25  --   ALT 13 15  --   ALKPHOS 79 73  --   BILITOT 0.9 0.9  --    ------------------------------------------------------------------------------------------------------------------ CrCl cannot be calculated (Unknown ideal weight.). ------------------------------------------------------------------------------------------------------------------ No results for input(s): HGBA1C in the last 72 hours. ------------------------------------------------------------------------------------------------------------------ No results for input(s): CHOL, HDL, LDLCALC, TRIG, CHOLHDL, LDLDIRECT in the last 72 hours. ------------------------------------------------------------------------------------------------------------------ No results for input(s): TSH, T4TOTAL, T3FREE, THYROIDAB in the last 72 hours.  Invalid input(s): FREET3 ------------------------------------------------------------------------------------------------------------------ No results for input(s): VITAMINB12, FOLATE, FERRITIN, TIBC, IRON, RETICCTPCT in the last 72 hours.  Coagulation profile  Recent Labs Lab  02/26/15 1926 02/27/15 0509 02/28/15 0900  INR 3.30* 3.09* 2.84*    No results for input(s): DDIMER in the last 72 hours.  Cardiac Enzymes  Recent Labs Lab 02/27/15 0509 02/27/15 0925 02/27/15 1540  TROPONINI <0.03 <0.03 <0.03   ------------------------------------------------------------------------------------------------------------------ Invalid input(s): POCBNP     Time Spent in minutes  35   Charistopher Rumble K M.D on 02/28/2015 at 11:31 AM  Between 7am to 7pm - Pager - [phone]  After 7pm go to [email or web address] - password [facility] [address]  [facility]   Office  [phone]

## 2015-02-28 NOTE — Consult Note (Signed)
[facility] Consult: 11:40 AM 02/28/2015  LOS: 1 day    Referring Provider: Thedore Mins MD  Primary Care Physician:  MD is in [facility] Primary Gastroenterologist:  Dr. Elnoria Howard, in 2007 .  ? Doreatha Martin in [place].    Reason for Consultation:  Dysphagia, odynophagia.    HPI: Patricia Klein is a 81 y.o. female.  PMH CHF, chronic Coumadin for Afib and history pulmonary embolus., CAD,  data cardiac stenting in [facility], status post cardiac pacemaker placement. CVA, RA, stage 3 CKD, pancytopenia.    Admitted to [facility] 02/20/15 through 02/23/15 for what sounds like UTI. For 2 weeks patient has been having dysphagia/odynophagia. Describes regurgitating liquids and solid foods. When she tries to swallow she feels pain from her upper esophageal to her lower esophageal region. She doesn't have significant nausea nor abdominal pain. During the [facility] regional admission this issue was not addressed per patient description. She did not undergo any swallowing evaluations or upper endoscopy per her recall, which is pretty good. Her family doctor was trying to arrange office visit with Dr. Elnoria Howard who had performed colonoscopy and EGD in 2007. On these studies she had Candida esophagitis, hiatal hernia, GERD, colonic diverticulosis.Marland Kitchen  However the swallowing has gotten to the point where she really isn't taking much in and her daughter brought her to [place] in [facility] where she was then transferred to [place].  Patient had had about 5 watery, nonbloody stools on Sunday but no bowel movements yesterday and she had a formed, brown bowel movement this morning. Stool has been sent for pathogen panel testing.  Patient's hemoglobin on 02/24/15 was 9.4, MCV was normal, platelets reached a low of 89.   Ferritin on 01/11/15 was 105. On 02/28/15 hemoglobin is 9.6.  On 02/22/15 platelets were 89.   They are 124-126 currently. Yesterday, 229/16 she was seen for bedside swallow evaluation by SLP.  Their note mentions oral dysphagia characterized by difficulty masticating, most likely due to poor dentition. She had functional pharyngeal phase.  Her complaints lead SLP to question an element of esophageal dysphagia. SLP approved her for fine chopped/dysphagia to diet with thin liquids. Med list includes methotrexate once weekly, 81 mg aspirin daily and Percocet prn for rheumatic pain.  She has dental issues.  She has bleeding from her gums. She also has need of multiple dental extractions which need to be done in a hospital setting and she has yet to be set up with an oral surgeon but has been seen by a dentist within the last few weeks.       Past Medical History  Diagnosis Date  . Asthma   . Arthritis   . CHF (congestive heart failure)   . Thyroid disease   . Stroke   . Coronary artery disease   . Hypoglycemia   . Hypertension   . Renal disorder   . Atrial fibrillation     On chronic Coumadin.  Marland Kitchen GERD (gastroesophageal reflux disease) 07/2006    GERD/HH/Candida esophagitis on EGD  2007  . Pulmonary embolus     On chronic Coumadin  . Diverticulosis 07/2006    On colonoscopy by Dr. Elnoria Howard. Diverticulosis in sigmoid and ascending colon. Internal/external hemorrhoids    Past Surgical History  Procedure Laterality Date  . Joint replacement      bilateral knee and RT hip  . Pacemaker placement    . Appendectomy    . Cystoscopy    . Colonoscopy  07/2006    Dr. Elnoria Howard. Diverticuli in the descending and sigmoid colon. Internal/external hemorrhoids  . Esophagogastroduodenoscopy  07/2006    Dr Elnoria Howard.  candida esophagitis, GERD, HH    Prior to Admission medications   Medication Sig Start Date End Date Taking? Authorizing Provider  albuterol (PROVENTIL HFA;VENTOLIN HFA) 108 (90 BASE) MCG/ACT inhaler  Inhale 2 puffs into the lungs every 6 (six) hours as needed for wheezing or shortness of breath.    Yes Historical Provider, MD  aspirin EC 81 MG tablet Take 81 mg by mouth daily.   Yes Historical Provider, MD  cholecalciferol (VITAMIN D) 1000 UNITS tablet Take 1,000 Units by mouth daily.   Yes Historical Provider, MD  diltiazem (CARDIZEM) 30 MG tablet Take 30 mg by mouth 2 (two) times daily. 12/28/14  Yes Historical Provider, MD  furosemide (LASIX) 40 MG tablet Take 40 mg by mouth 2 (two) times daily. 12/28/14  Yes Historical Provider, MD  metoprolol (LOPRESSOR) 100 MG tablet Take 100 mg by mouth 2 (two) times daily.  02/02/15  Yes Historical Provider, MD  nitroGLYCERIN (NITROSTAT) 0.4 MG SL tablet Place 0.4 mg under the tongue every 5 (five) minutes as needed for chest pain.    Yes Historical Provider, MD  nystatin (MYCOSTATIN/NYSTOP) 100000 UNIT/GM POWD Apply topically 3 (three) times daily as needed (irritation under breasts, under stomach folds and vaginal area).  01/25/15  Yes Historical Provider, MD  oxyCODONE-acetaminophen (PERCOCET/ROXICET) 5-325 MG per tablet Take 1 tablet by mouth every 6 (six) hours as needed (pain).  02/17/15  Yes Historical Provider, MD  potassium chloride SA (K-DUR,KLOR-CON) 20 MEQ tablet Take 20 mEq by mouth daily.  02/22/15  Yes Historical Provider, MD  pravastatin (PRAVACHOL) 20 MG tablet Take 20 mg by mouth at bedtime.  02/23/15  Yes Historical Provider, MD  sennosides-docusate sodium (SENOKOT-S) 8.6-50 MG tablet Take 1 tablet by mouth daily as needed.    Yes Historical Provider, MD  traMADol (ULTRAM) 50 MG tablet Take 50 mg by mouth 2 (two) times daily. 02/17/15  Yes Historical Provider, MD  warfarin (COUMADIN) 2 MG tablet Take 1-2 mg by mouth daily after supper. Take 1/2 tablet (1 mg) on Wednesday and Friday at 5pm; take 1 tablet (2 mg) on Sunday, Monday, Tuesday, Thursday, Saturday at 5pm 02/07/15  Yes Historical Provider, MD  methotrexate (RHEUMATREX) 2.5 MG tablet 15 mg  once a week. On Mondays 02/07/15   Historical Provider, MD    Scheduled Meds: . antiseptic oral rinse  7 mL Mouth Rinse q12n4p  . aspirin EC  81 mg Oral Daily  . chlorhexidine  15 mL Mouth Rinse BID  . cholecalciferol  1,000 Units Oral Daily  . diltiazem  30 mg Oral BID  . feeding supplement (ENSURE COMPLETE)  237 mL Oral BID BM  . pantoprazole  40 mg Oral Q1200  . pravastatin  20 mg Oral QHS  . sodium chloride  3 mL Intravenous Q12H  . traMADol  50 mg Oral BID  . Warfarin - Pharmacist Dosing Inpatient   Does not apply  q1800  . white petrolatum       Infusions: . sodium chloride     PRN Meds: albuterol, hydrOXYzine, morphine injection, nitroGLYCERIN, nystatin   Allergies as of 02/26/2015 - Review Complete 02/26/2015  Allergen Reaction Noted  . Gabapentin Itching 02/26/2015  . Penicillins Itching 02/26/2015  . Sulfa antibiotics Itching 02/26/2015  . Hydrocodone-acetaminophen Itching 02/26/2015    Family History  Problem Relation Age of Onset  . Heart attack Mother   . Heart attack Father   . Heart attack Brother     History   Social History  . Marital Status: Widowed    Spouse Name: N/A  . Number of Children: N/A  . Years of Education: N/A   Occupational History  . Not on file.   Social History Main Topics  . Smoking status: Never Smoker   . Smokeless tobacco: Not on file  . Alcohol Use: No  . Drug Use: Not on file  . Sexual Activity: Not on file   Other Topics Concern  . Not on file   Social History Narrative   Patient is illiterate, she cannot read or write.    REVIEW OF SYSTEMS: Constitutional:  Not clear if she's had recent significant weight loss but in past years she weighed up to 250 pounds. ENT:  No nose bleeds. Occasional bleeding from her gums. Sometimes she has sores in her mouth. Pulm:  No cough. Some DOE though activity is quite limited. CV:  No palpitations, no LE edema. No chest pain  GU:  No hematuria, no frequency GI:  Per hpi Heme:    No significant bruising. Transfusions:  Not per her recall. Neuro:  No headaches, no peripheral tingling or numbness Derm:  No itching, no rash or sores. No bleeding from her skin Endocrine:  No sweats or chills.  No polyuria or dysuria Immunization:  Did not inquire. Travel:  None beyond local counties in last few months.    PHYSICAL EXAM: Vital signs in last 24 hours: Filed Vitals:   02/28/15 0940  BP: 113/56  Pulse: 67  Temp: 97.9 F (36.6 C)  Resp: 18   Wt Readings from Last 3 Encounters:  02/27/15 224 lb 8 oz (101.833 kg)    General: Pleasant, aged, obese AAF. She is comfortable. She looks chronically ill. Head:  No facial asymmetry or swelling.  Eyes:  Scleral icterus or conjunctival pallor. EOMI. Ears:  Slightly HOH.  Nose:  No congestion or discharge. Mouth:  Dental caries, absent dentition, red blood seen on the right lower jaw buccal mucosa behind incisor Neck:  No JVD, no thyromegaly. Lungs:  Some dyspnea with activity. No cough. Breath sounds distant but clear. Heart: Irregular. Not tachycardic or bradycardic. Not all beats are paced per review of cardiac telemetry monitor. No MRG. Abdomen:  Obese with large pannus. NT, ND. Bowel sounds active..   Rectal: Deferred   Musc/Skeltl: Some swelling and general deformity of the knee and foot joints. Extremities:  Lower extremity 1+ edema bilaterally.  Neurologic:  Patient is appropriate, she is alert. She is oriented to place, year and herself. Skin:  Macerated skin in the folds of the abdominal pannus. Tattoos:  None Nodes:  No cervical adenopathy   Psych:  Wasn't, in good spirits, not anxious or agitated.  Intake/Output from previous day: 02/29 0701 - 03/01 0700 In: 2423.8 [P.O.:480; I.V.:1943.8] Out: 225 [Urine:225] Intake/Output this shift: Total I/O In: 360 [P.O.:360] Out: 1 [Urine:1]  LAB RESULTS:  Recent Labs  02/26/15 1926 02/27/15  0509 02/28/15 0900  WBC 1.8* 1.7* 2.5*  HGB 8.9* 8.6* 9.6*   HCT 26.3* 24.9* 28.8*  PLT 133* 124* 126*   BMET Lab Results  Component Value Date   NA 140 02/28/2015   NA 141 02/27/2015   NA 138 02/26/2015   K 4.1 02/28/2015   K 3.9 02/27/2015   K 3.6 02/26/2015   CL 111 02/28/2015   CL 108 02/27/2015   CL 105 02/26/2015   CO2 23 02/28/2015   CO2 26 02/27/2015   CO2 27 02/26/2015   GLUCOSE 104* 02/28/2015   GLUCOSE 86 02/27/2015   GLUCOSE 92 02/26/2015   BUN 9 02/28/2015   BUN 14 02/27/2015   BUN 16 02/26/2015   CREATININE 1.13* 02/28/2015   CREATININE 1.29* 02/27/2015   CREATININE 1.34* 02/26/2015   CALCIUM 8.8 02/28/2015   CALCIUM 8.8 02/27/2015   CALCIUM 8.7 02/26/2015   LFT  Recent Labs  02/26/15 1926 02/27/15 0509  PROT 6.7 6.4  ALBUMIN 3.1* 3.0*  AST 22 25  ALT 13 15  ALKPHOS 79 73  BILITOT 0.9 0.9   PT/INR Lab Results  Component Value Date   INR 2.84* 02/28/2015   INR 3.09* 02/27/2015   INR 3.30* 02/26/2015   Hepatitis Panel No results for input(s): HEPBSAG, HCVAB, HEPAIGM, HEPBIGM in the last 72 hours. C-Diff No components found for: CDIFF Lipase     Component Value Date/Time   LIPASE 33 02/26/2015 1926    Drugs of Abuse  No results found for: LABOPIA, COCAINSCRNUR, LABBENZ, AMPHETMU, THCU, LABBARB   RADIOLOGY STUDIES: Dg Chest 2 View  02/26/2015   CLINICAL DATA:  Chest pain and weakness for 2 days  EXAM: CHEST  2 VIEW  COMPARISON:  02/14/2015  FINDINGS: Cardiac shadow remains enlarged. A pacing device is again seen. Degenerative changes of the shoulder joints are again noted bilaterally. No focal infiltrate or sizable effusion is seen.  IMPRESSION: No acute abnormality noted.   Electronically Signed   By: Alcide Clever M.D.   On: 02/26/2015 20:06    ENDOSCOPIC STUDIES: EGD 08/07/2006  Dr Elnoria Howard This showed GERD, hiatal hernia, Candida esophagitis. She was treated with Diflucan  Colonoscopy 08/07/2006  Dr Elnoria Howard Screening study Diverticuli noted in the descending and sigmoid colon. Internal/external  hemorrhoids noted.  IMPRESSION:   *  Dysphagia/odynophagia. History of GERD, HH, Candida esophagitis. Wonder if she has recurrent Candida esophagitis though no fungal changes noted in oral/pharyngeal visual exam. Contributing to the dysphagia may be her poor dental repair and difficulty chewing.  *  Normocytic anemia.  *  Diarrhea for 1 day on 02/26/15. This is resolved. Stool pathogen panel is pending. Had been treated for UTI last week per [facility].  *  Thrombocytopenia.  *  Chronic Coumadin for history of A. fib, CVA and PE. INR 2.8, therapeutic.  *  Chronic anemia.  Normocytic.  Per review of care everywhere, patient had been taking ferrous sulfate 325 MG daily as of mid January 2016.  *  Rheumatoid arthritis. Takes methotrexate once per week.  As of 12/06/14 her liver transaminases and total bilirubin were normal. Her alkaline phosphatase was up a bit at 106 (rr 32 - 98)   PLAN:     *  Per Dr Arlyce Dice.  Wonder about empiric treatment with oral Diflucan?  Pt higher risk for sedation if EGD planned.  A safer alternative may be to perform barium esophagram.    Jennye Moccasin  02/28/2015, 11:40 AM Pager: [phone]  GI Attending Note   Chart was reviewed and patient was examined. X-rays and lab were reviewed.   Dysphagia could be due to Candida esophagitis, esophageal dysmotility, or, less likely, esophageal stricture.  Recommend proceeding with barium swallow.  Barbette Hair. Arlyce Dice, M.D., [facility] [phone]\

## 2015-02-28 NOTE — Progress Notes (Signed)
Utilization Review Completed.Patricia Klein T3/12/2014  

## 2015-02-28 NOTE — Evaluation (Signed)
Occupational Therapy Evaluation Patient Details Name: Patricia Klein MRN: [id number] DOB: 01-Jun-1928 Today's Date: 02/28/2015    History of Present Illness Pt is an 81 y.o. female with PMH of congestive heart failure, atrial fibrillation, CAD, CVA, hyperlipidemia, RA, CKD-III, pancytopenia. Pt presents with nausea, vomiting, diarrhea, abdominal pain x2 weeks. She was recently treated with abx for UTI in [facility]. Patient states she has epigastric/chest pain that is worse with eating and is burning. She also complains of LEE due to CHF and venous insufficiency. She reports that she is compliant to her diuretics at home. She was recently seen by her primary care provider and was diagnosed with thrombocytopenia and possible gastritis.   Clinical Impression   Pt admitted with above. Pt requiring assist for ADLs, PTA. Feel pt will benefit from acute OT to increase independence with BADLs and decrease caregiver burden, prior to d/c. Recommending SNF for rehab. Pt not wanting to go to SNF, but willing to consider.    Follow Up Recommendations  SNF;Supervision/Assistance - 24 hour    Equipment Recommendations  3 in 1 bedside comode    Recommendations for Other Services       Precautions / Restrictions Precautions Precautions: Fall Restrictions Weight Bearing Restrictions: No      Mobility Bed Mobility Overal bed mobility: Needs Assistance Bed Mobility: Supine to Sit;Sit to Supine     Supine to sit: Max assist Sit to supine: Mod assist   General bed mobility comments: Cues for technique for bed mobility. Assist with hips and trunk to get to EOB. Assist with LE's to return to supine.trendlenburg position used to scoot HOB and +2 assist.   Transfers Overall transfer level: Needs assistance Equipment used: Rolling walker (2 wheeled) Transfers: Sit to/from Stand Sit to Stand: Mod assist         General transfer comment: cues for hand placement/technique.     Balance    Min guard for ambulation with RW.                                         ADL Overall ADL's : Needs assistance/impaired     Grooming: Oral care;Brushing hair;Min guard;Standing               Lower Body Dressing: Maximal assistance;Sit to/from stand   Toilet Transfer: Min guard;Ambulation;RW (bed; Mod A for sit to stand from bed)           Functional mobility during ADLs: Min guard;Rolling walker General ADL Comments: Briefly discussed AE is available for LB ADLs. Recommending SNF for pt and notified pt of this and feel it is safest option.     Vision     Perception     Praxis      Pertinent Vitals/Pain Pain Assessment: No/denies pain     Hand Dominance Right   Extremity/Trunk Assessment Upper Extremity Assessment Upper Extremity Assessment: Generalized weakness;LUE deficits/detail LUE Deficits / Details: limited AROM shoulder flexion-reports previous injury   Lower Extremity Assessment Lower Extremity Assessment: Defer to PT evaluation   Cervical / Trunk Assessment Cervical / Trunk Assessment: Kyphotic   Communication Communication Communication: No difficulties   Cognition Arousal/Alertness: Awake/alert Behavior During Therapy: WFL for tasks assessed/performed Overall Cognitive Status: No family/caregiver present to determine baseline cognitive functioning  General Comments       Exercises       Shoulder Instructions      Home Living Family/patient expects to be discharged to:: Private residence Living Arrangements: Children Available Help at Discharge: Family;Available PRN/intermittently Type of Home: House Home Access: Stairs to enter [employer] of Steps: 1   Home Layout: One level     Bathroom Shower/Tub: Chief Strategy Officer: Standard     Home Equipment: Cane - single point;Bedside commode;Tub bench (states BSC is rusted-has had for a long time)    Additional Comments: 2 steps within home to get to den or kitchen       Prior Functioning/Environment Level of Independence: Needs assistance  Gait / Transfers Assistance Needed: Uses cane and furniture walks all the time  ADL's / Homemaking Assistance Needed: assist with bathing and dressing        OT Diagnosis: Generalized weakness   OT Problem List: Impaired UE functional use;Obesity;Decreased knowledge of use of DME or AE;Decreased knowledge of precautions;Decreased range of motion;Decreased activity tolerance;Decreased safety awareness;Decreased strength   OT Treatment/Interventions: Self-care/ADL training;Therapeutic exercise;DME and/or AE instruction;Therapeutic activities;Patient/family education;Balance training;Cognitive remediation/compensation    OT Goals(Current goals can be found in the care plan section) Acute Rehab OT Goals Patient Stated Goal: go home OT Goal Formulation: With patient Time For Goal Achievement: 03/07/15 Potential to Achieve Goals: Good ADL Goals Pt Will Perform Lower Body Dressing: with min assist;sit to/from stand;with adaptive equipment Pt Will Transfer to Toilet: ambulating;with supervision (3 in 1 over commode) Pt Will Perform Toileting - Clothing Manipulation and hygiene: sit to/from stand;with supervision Additional ADL Goal #1: Pt will perform bed mobility at Min A level as precursor for ADLs.  OT Frequency: Min 2X/week   Barriers to D/C:            Co-evaluation              End of Session Equipment Utilized During Treatment: Gait belt;Rolling walker  Activity Tolerance: Patient tolerated treatment well Patient left: in bed;with call bell/phone within reach;with bed alarm set;with SCD's reapplied;Other (comment) (with MD)   Time: 3875-6433 OT Time Calculation (min): 18 min Charges:  OT General Charges $OT Visit: 1 Procedure OT Evaluation $Initial OT Evaluation Tier I: 1 Procedure G-CodesEarlie Raveling  OTR/L [phone] 02/28/2015, 10:58 AM

## 2015-02-28 NOTE — Progress Notes (Signed)
ANTICOAGULATION CONSULT NOTE - Follow Up Consult  Pharmacy Consult for Warfarin Indication: atrial fibrillation  Allergies  Allergen Reactions  . Gabapentin Itching  . Penicillins Itching  . Sulfa Antibiotics Itching  . Hydrocodone-Acetaminophen Itching    Patient Measurements: Weight: 224 lb 8 oz (101.833 kg)  Vital Signs: Temp: 97.9 F (36.6 C) (03/01 0940) Temp Source: Oral (03/01 0940) BP: 113/56 mmHg (03/01 0940) Pulse Rate: 67 (03/01 0940)  Labs:  Recent Labs  02/26/15 1926 02/27/15 0509 02/27/15 0925 02/27/15 1540 02/28/15 0900  HGB 8.9* 8.6*  --   --  9.6*  HCT 26.3* 24.9*  --   --  28.8*  PLT 133* 124*  --   --  126*  LABPROT 33.8* 32.1*  --   --  30.1*  INR 3.30* 3.09*  --   --  2.84*  CREATININE 1.34* 1.29*  --   --  1.13*  TROPONINI  --  <0.03 <0.03 <0.03  --     CrCl cannot be calculated (Unknown ideal weight.).   Assessment: 75 YOF who presented on 2/28 with N/V/D and abdominal pain, admitted for further evaluation. Pharmacy was consulted to continue warfarin for anticoagulation. Admit INR was elevated at 3.3 likely due to recent N/V/D and poor appetite. FOBT was noted to be positive but no gross bleeding from rectum, last dose of Coumadin PTA was 2/28.  INR today has trended down to a therapeutic range after holding the warfarin dose on 2/29 (INR 2.84 << 3.3, goal of 2-3). CBC low but stable. GI on board for evaluation. The patient is eating well (100% of meals charted). Will resume warfarin this evening to keep within range.  PTA warfarin dose was 2 mg daily EXCEPT for 1 mg on Wed/Fri (dose confirmed with the patient). The patient was re-educated on warfarin today.   Goal of Therapy:  INR 2-3   Plan:  1. Warfarin 2 mg x 1 dose at 1800 today 2. Will continue to monitor for any signs/symptoms of bleeding and will follow up with PT/INR in the a.m.   Georgina Pillion, PharmD, BCPS Clinical Pharmacist Pager: [phone] 02/28/2015 11:56 AM

## 2015-03-01 ENCOUNTER — Inpatient Hospital Stay (HOSPITAL_COMMUNITY): Payer: Medicare Other

## 2015-03-01 DIAGNOSIS — R112 Nausea with vomiting, unspecified: Secondary | ICD-10-CM

## 2015-03-01 DIAGNOSIS — R1314 Dysphagia, pharyngoesophageal phase: Secondary | ICD-10-CM

## 2015-03-01 DIAGNOSIS — R197 Diarrhea, unspecified: Secondary | ICD-10-CM

## 2015-03-01 LAB — CBC
HEMATOCRIT: 27.8 % — AB (ref 36.0–46.0)
Hemoglobin: 9.3 g/dL — ABNORMAL LOW (ref 12.0–15.0)
MCH: 30.3 pg (ref 26.0–34.0)
MCHC: 33.5 g/dL (ref 30.0–36.0)
MCV: 90.6 fL (ref 78.0–100.0)
PLATELETS: 151 10*3/uL (ref 150–400)
RBC: 3.07 MIL/uL — ABNORMAL LOW (ref 3.87–5.11)
RDW: 21.9 % — AB (ref 11.5–15.5)
WBC: 3.6 10*3/uL — ABNORMAL LOW (ref 4.0–10.5)

## 2015-03-01 LAB — PROTIME-INR
INR: 2.7 — AB (ref 0.00–1.49)
PROTHROMBIN TIME: 28.9 s — AB (ref 11.6–15.2)

## 2015-03-01 LAB — URINE CULTURE

## 2015-03-01 LAB — BASIC METABOLIC PANEL
Anion gap: 6 (ref 5–15)
BUN: 8 mg/dL (ref 6–23)
CALCIUM: 8.7 mg/dL (ref 8.4–10.5)
CHLORIDE: 110 mmol/L (ref 96–112)
CO2: 24 mmol/L (ref 19–32)
Creatinine, Ser: 1.08 mg/dL (ref 0.50–1.10)
GFR, EST AFRICAN AMERICAN: 53 mL/min — AB (ref >90)
GFR, EST NON AFRICAN AMERICAN: 46 mL/min — AB (ref >90)
Glucose, Bld: 84 mg/dL (ref 70–99)
POTASSIUM: 4 mmol/L (ref 3.5–5.1)
SODIUM: 140 mmol/L (ref 135–145)

## 2015-03-01 MED ORDER — WARFARIN SODIUM 1 MG PO TABS
1.0000 mg | ORAL_TABLET | Freq: Once | ORAL | Status: AC
  Administered 2015-03-01: 1 mg via ORAL
  Filled 2015-03-01: qty 1

## 2015-03-01 NOTE — Progress Notes (Signed)
[facility] PROGRESS NOTE  Patricia Klein ZOX:[id number] DOB: 03-21-1928 DOA: 02/26/2015 PCP: No primary care provider on file.  Assessment/Plan: 1. Dysphagia/odynophagia - GI on board currently patient undergoing workup   2. Viral gastroenteritis - resolving   3. Atypical chest pain. Secondary to odynophagia/dysphasia. continues to be chest pain-free, troponin negative 3. Continue combination of aspirin statin and Coumadin for secondary prevention.   4. Chronic Atrial Fibrillation: CHA2DS2-VASc Score 7  - Pharmacy managing Coumadin - Currently on diltiazem   5. Chr diastolic dysfunction EF 55-60% this admission. Continue current regimen, stable, compensated   6. History of asthma.  - Stable   7. History of rheumatoid arthritis. On methotrexate which is being held as she developed pancytopenia upon admission. Outpatient follow-up with her rheumatologist.   8. Pancytopenia. Could be secondary to methotrexate which is on hold, blood counts improving, will have her follow with her primary rheumatologist postdischarge.   9. ARF on chronic kidney disease stage III. Baseline creatinine 1.3. Was mildly dehydrated, After hydration she is at baseline.  Code Status: full Family Communication: None at bedside Disposition Plan: Pending improvement in condition and further work up from specialist   Consultants:  GI  Procedures:  pending  Antibiotics:  None  HPI/Subjective: The patient has no new complaints  Objective: Filed Vitals:   03/01/15 0912  BP: 132/55  Pulse: 96  Temp: 97.5 F (36.4 C)  Resp: 18    Intake/Output Summary (Last 24 hours) at 03/01/15 1620 Last data filed at 03/01/15 1339  Gross per 24 hour  Intake    720 ml  Output    851 ml  Net   -131 ml   Filed Weights   02/27/15 0627 02/27/15 2048 03/01/15 0033  Weight: 101.3 kg (223 lb 5.2 oz) 101.833 kg (224 lb 8 oz) 107.094 kg (236 lb 1.6 oz)    Exam:   General:  Patient in no  acute distress, alert and awake  Cardiovascular: Rate and rhythm, no murmurs or rubs  Respiratory: Clear to auscultation bilaterally, no wheezes  Abdomen: soft, nd, nt  Musculoskeletal: no cyanosis or clubbing   Data Reviewed: Basic Metabolic Panel:  Recent Labs Lab 02/26/15 1926 02/27/15 0509 02/28/15 0900 03/01/15 0719  NA 138 141 140 140  K 3.6 3.9 4.1 4.0  CL 105 108 111 110  CO2 27 26 23 24   GLUCOSE 92 86 104* 84  BUN 16 14 9 8   CREATININE 1.34* 1.29* 1.13* 1.08  CALCIUM 8.7 8.8 8.8 8.7   Liver Function Tests:  Recent Labs Lab 02/26/15 1926 02/27/15 0509  AST 22 25  ALT 13 15  ALKPHOS 79 73  BILITOT 0.9 0.9  PROT 6.7 6.4  ALBUMIN 3.1* 3.0*    Recent Labs Lab 02/26/15 1926  LIPASE 33   No results for input(s): AMMONIA in the last 168 hours. CBC:  Recent Labs Lab 02/26/15 1926 02/27/15 0509 02/28/15 0900 03/01/15 0719  WBC 1.8* 1.7* 2.5* 3.6*  NEUTROABS 0.5*  --   --   --   HGB 8.9* 8.6* 9.6* 9.3*  HCT 26.3* 24.9* 28.8* 27.8*  MCV 88.6 89.2 91.4 90.6  PLT 133* 124* 126* 151   Cardiac Enzymes:  Recent Labs Lab 02/27/15 0509 02/27/15 0925 02/27/15 1540  TROPONINI <0.03 <0.03 <0.03   BNP (last 3 results)  Recent Labs  02/27/15 0521  BNP 426.0*    ProBNP (last 3 results) No results for input(s): PROBNP in the last 8760 hours.  CBG: No results  for input(s): GLUCAP in the last 168 hours.  Recent Results (from the past 240 hour(s))  Culture, blood (routine x 2)     Status: None (Preliminary result)   Collection Time: 02/27/15  5:09 AM  Result Value Ref Range Status   Specimen Description BLOOD LEFT HAND  Final   Special Requests BOTTLES DRAWN AEROBIC ONLY 2CC  Final   Culture   Final           BLOOD CULTURE RECEIVED NO GROWTH TO DATE CULTURE WILL BE HELD FOR 5 DAYS BEFORE ISSUING A FINAL NEGATIVE REPORT Performed at [facility]    Report Status PENDING  Incomplete  Culture, blood (routine x 2)     Status: None  (Preliminary result)   Collection Time: 02/27/15  5:21 AM  Result Value Ref Range Status   Specimen Description BLOOD LEFT FOREARM  Final   Special Requests BOTTLES DRAWN AEROBIC AND ANAEROBIC 10CC EACH  Final   Culture   Final           BLOOD CULTURE RECEIVED NO GROWTH TO DATE CULTURE WILL BE HELD FOR 5 DAYS BEFORE ISSUING A FINAL NEGATIVE REPORT Performed at [facility]    Report Status PENDING  Incomplete  Urine culture     Status: None   Collection Time: 02/27/15  6:31 PM  Result Value Ref Range Status   Specimen Description URINE, CLEAN CATCH  Final   Special Requests NONE  Final   Colony Count   Final    >=100,000 COLONIES/ML Performed at [facility]    Culture   Final    Multiple bacterial morphotypes present, none predominant. Suggest appropriate recollection if clinically indicated. Performed at [facility]    Report Status 03/01/2015 FINAL  Final  Clostridium Difficile by PCR     Status: None   Collection Time: 02/28/15  9:37 AM  Result Value Ref Range Status   C difficile by pcr NEGATIVE NEGATIVE Final     Studies: Dg Esophagus  03/01/2015   CLINICAL DATA:  Dysphagia.  EXAM: ESOPHOGRAM/BARIUM SWALLOW  TECHNIQUE: Single contrast examination was performed using  thin barium.  FLUOROSCOPY TIME:  3 min and 12 seconds  COMPARISON:  Chest radiograph of 02/26/2015  FINDINGS: Focused exam was performed with the patient in an LPO position. Moderate limitations secondary to patient immobility and clinical status.  Throughout the exam, including on the initial swallow, there is moderate to marked esophageal dysmotility, with contrast stasis throughout the esophagus. No persistent narrowing to suggest stricture. Small hiatal hernia.  IMPRESSION: 1. Esophageal dysmotility, likely presbyesophagus. 2. Small hiatal hernia. 3. Limited exam, as detailed above.   Electronically Signed   By: Jeronimo Greaves M.D.   On: 03/01/2015 11:40    Scheduled Meds: . antiseptic  oral rinse  7 mL Mouth Rinse q12n4p  . aspirin EC  81 mg Oral Daily  . chlorhexidine  15 mL Mouth Rinse BID  . cholecalciferol  1,000 Units Oral Daily  . diltiazem  30 mg Oral BID  . feeding supplement (ENSURE COMPLETE)  237 mL Oral BID BM  . pantoprazole  40 mg Oral Q1200  . pravastatin  20 mg Oral QHS  . sodium chloride  3 mL Intravenous Q12H  . traMADol  50 mg Oral BID  . warfarin  1 mg Oral ONCE-1800  . Warfarin - Pharmacist Dosing Inpatient   Does not apply q1800   Continuous Infusions:   Principal Problem:   Nausea vomiting and  diarrhea Active Problems:   HYPERTENSION, BENIGN SYSTEMIC   Atrial fibrillation   Asthma   CHF (congestive heart failure)   Hypertension   CAD (coronary artery disease)   CKD (chronic kidney disease), stage III   Chest pain   RA (rheumatoid arthritis)   Pancytopenia   Dysphagia, pharyngoesophageal phase    Time spent: > 35 minutes    Penny Pia  [facility] Pager [phone] If 7PM-7AM, please contact night-coverage at [email or web address], password [facility] 03/01/2015, 4:20 PM  LOS: 2 days

## 2015-03-01 NOTE — Progress Notes (Signed)
Speech Language Pathology Treatment: Dysphagia  Patient Details Name: Patricia Klein MRN: [id number] DOB: 1928-07-05 Today's Date: 03/01/2015 Time: 0102-7253 SLP Time Calculation (min) (ACUTE ONLY): 13 min  Assessment / Plan / Recommendation Clinical Impression  Pt with slow but effective mastication with Dys 2 textures, and reports making modifications at home PTA to compensate for condition of dentition. No signs of aspiration are observed across trials of solids/liquids. SLP provided Min cues for utilization of esophageal precautions. No further SLP f/u needed at this time.   HPI HPI: Patricia Klein is a 81 y.o. female with PMH of congestive heart failure, atrial fibrillation on Coumadin, coronary artery disease, stroke, hyperlipidemia, rheumatoid arthritis, chronic kidney disease-III, pancytopenia, coronary artery disease, who presents with nausea, vomiting, diarrhea, abdominal pain.  Patient reports that has nausea, vomiting, diarrhea in the past 2 weeks. She had 4 bowel movements with watery stool today. She was recently treated with abx for UTI in [facility]. Patient states she has epigastric/chest pain that is worse with eating and is burning. Pain is constant. No shortness of breath. She also complains of leg edema due to CHF and venous insufficiency. She reports that she is compliant to her diuretics at home. She was recently seen by her primary care provider and was diagnosed with thrombocytopenia and possible gastritis. Patient with GI follow-up March 8. She was given referral to hematology. She has stopped taking metoprolol. Patient denies fever, chills, dysuria, urgency, frequency, hematuria, skin rashes, No unilateral weakness, numbness or tingling sensations. No vision change or hearing loss.   Pertinent Vitals Pain Assessment: No/denies pain  SLP Plan  All goals met    Recommendations Diet recommendations: Dysphagia 2 (fine chop);Thin liquid Liquids provided via:  Cup;Straw Medication Administration: Crushed with puree Supervision: Patient able to self feed Compensations: Slow rate;Small sips/bites;Follow solids with liquid Postural Changes and/or Swallow Maneuvers: Seated upright 90 degrees;Upright 30-60 min after meal       Oral Care Recommendations: Oral care BID Follow up Recommendations: None Plan: All goals met      Germain Osgood, M.A. CCC-SLP [phone]  Germain Osgood 03/01/2015, 11:48 AM

## 2015-03-01 NOTE — Progress Notes (Addendum)
Occupational Therapy Treatment Patient Details Name: Patricia Klein MRN: [id number] DOB: 08/26/1928 Today's Date: 03/01/2015    History of present illness Pt is an 81 y.o. female with PMH of congestive heart failure, atrial fibrillation, CAD, CVA, hyperlipidemia, RA, CKD-III, pancytopenia. Pt presents with nausea, vomiting, diarrhea, abdominal pain x2 weeks. She was recently treated with abx for UTI in [facility]. Patient states she has epigastric/chest pain that is worse with eating and is burning. She also complains of LEE due to CHF and venous insufficiency. She reports that she is compliant to her diuretics at home. She was recently seen by her primary care provider and was diagnosed with thrombocytopenia and possible gastritis.   OT comments  Education provided in session. Continue to recommend SNF, however pt is refusing and wanting to go home. Recommend HHOT if pt continues to refuse. Pt's HR up to 160's when ambulating in hallway with OT (trended down to 90's-100 at end of session).  Follow Up Recommendations  SNF;Supervision/Assistance - 24 hour    Equipment Recommendations  3 in 1 bedside comode    Recommendations for Other Services      Precautions / Restrictions Precautions Precautions: Fall Restrictions Weight Bearing Restrictions: No       Mobility Bed Mobility               General bed mobility comments: not assessed  Transfers Overall transfer level: Needs assistance Equipment used: Rolling walker (2 wheeled) Transfers: Sit to/from Stand Sit to Stand: Max assist (would require +2 assist at end of session as OT could not stand her with +1 assist)         General transfer comment: Cues for technique. Heavy assist from recliner chair. Attempted to stand again from chair at end of session and could not stand (could be due to fatigue). Tried using rocking technique, but still could not stand.    Balance Min guard for ambulation with RW.  Supervision while standing at sink for grooming.                    ADL Overall ADL's : Needs assistance/impaired     Grooming: Oral care;Set up;Supervision/safety;Standing (items already at sink)               Lower Body Dressing: Maximal assistance;With adaptive equipment;Sitting/lateral leans (socks) (difficulty doffing socks with standard reacher-feel longer reacher will work better for her; wide sockaid worked better for pt compared to smaller one)   Statistician: Min guard;Ambulation;RW (chair; Max A for sit to stand from chair)           Functional mobility during ADLs: Min guard;Rolling walker General ADL Comments: Cues for pt to take break as nursing notified OT that pt's HR was up to 160's when ambulating in hallway. Cues for deep breathing technique. Educated on AE/cost/where to purchase and pt practiced (feel a longer reacher will work better). Educated on safety such as use of bag on walker.  Explained that OT still recommending SNF, but pt refusing. Educated on energy conservation.      Vision                     Perception     Praxis      Cognition  Awake/Alert Behavior During Therapy: [facility] for tasks assessed/performed Overall Cognitive Status: No family/caregiver present to determine baseline cognitive functioning  Extremity/Trunk Assessment               Exercises     Shoulder Instructions       General Comments      Pertinent Vitals/ Pain       Pain Assessment: No/denies pain  Home Living                                          Prior Functioning/Environment              Frequency Min 2X/week     Progress Toward Goals  OT Goals(current goals can now be found in the care plan section)  Progress towards OT goals: Progressing toward goals (able to don sock with wide sockaid)  Acute Rehab OT Goals Patient Stated Goal: go home OT Goal Formulation: With  patient Time For Goal Achievement: 03/07/15 Potential to Achieve Goals: Good ADL Goals Pt Will Perform Lower Body Dressing: with min assist;sit to/from stand;with adaptive equipment Pt Will Transfer to Toilet: ambulating;with supervision (3 in 1 over commode) Pt Will Perform Toileting - Clothing Manipulation and hygiene: sit to/from stand;with supervision Additional ADL Goal #1: Pt will perform bed mobility at Min A level as precursor for ADLs. Additional ADL Goal #2: Pt will independently perform HEP for bilateral UE's to increase strength.  Plan Discharge plan remains appropriate    Co-evaluation                 End of Session Equipment Utilized During Treatment: Gait belt;Rolling walker   Activity Tolerance Patient tolerated treatment well;Patient limited by fatigue   Patient Left in chair;with call bell/phone within reach   Nurse Communication Other (comment) (HR trended back down)        Time: 1536-1606 OT Time Calculation (min): 30 min  Charges: OT General Charges $OT Visit: 1 Procedure OT Treatments $Self Care/Home Management : 8-22 mins $Therapeutic Activity: 8-22 mins  Earlie Raveling OTR/L [phone] 03/01/2015, 4:23 PM

## 2015-03-01 NOTE — Care Management Note (Signed)
CARE MANAGEMENT NOTE 03/01/2015  Patient:  HETAL, PROANO   Account Number:  [id number]  Date Initiated:  03/01/2015  Documentation initiated by:  Dylann Layne  Subjective/Objective Assessment:   CM following for progression and d/c planning.     Action/Plan:   Met with pt who has 24 hr support of family and HH aide 3xper week. Pt requested 3:1 as her old one is approx 79 yr old and worn. 3:1 ordered for delivery to room.   Anticipated DC Date:  03/01/2015   Anticipated DC Plan:  [place]         Choice offered to / List presented to:             Status of service:  In process, will continue to follow Medicare Important Message given?  YES (If response is "NO", the following Medicare IM given date fields will be blank) Date Medicare IM given:  03/01/2015 Medicare IM given by:  Yeilyn Gent Date Additional Medicare IM given:   Additional Medicare IM given by:    Discharge Disposition:    Per UR Regulation:    If discussed at Long Length of Stay Meetings, dates discussed:    Comments:

## 2015-03-01 NOTE — Progress Notes (Signed)
Physical Therapy Treatment Patient Details Name: Patricia Klein MRN: [id number] DOB: 10-03-1928 Today's Date: 03/01/2015    History of Present Illness Pt is an 81 y.o. female with PMH of congestive heart failure, atrial fibrillation, CAD, CVA, hyperlipidemia, RA, CKD-III, pancytopenia. Pt presents with nausea, vomiting, diarrhea, abdominal pain x2 weeks. She was recently treated with abx for UTI in [facility]. Patient states she has epigastric/chest pain that is worse with eating and is burning. She also complains of LEE due to CHF and venous insufficiency. She reports that she is compliant to her diuretics at home. She was recently seen by her primary care provider and was diagnosed with thrombocytopenia and possible gastritis.    PT Comments    Pt progressing towards physical therapy goals. Pt reports she is at her baseline of function, however continues to demonstrate balance and strength deficits, as well as decreased tolerance for functional activity. PT continues to recommend continued skilled therapy in the SNF setting, however pt is currently declining. If pt returns home, will need HHPT to follow up and could benefit from 24 hour assist for safety.   Follow Up Recommendations  SNF;Supervision/Assistance - 24 hour     Equipment Recommendations  Rolling walker with 5" wheels;3in1 (PT)    Recommendations for Other Services       Precautions / Restrictions Precautions Precautions: Fall Restrictions Weight Bearing Restrictions: No    Mobility  Bed Mobility               General bed mobility comments: Pt sitting up in recliner upon PT arrival.   Transfers Overall transfer level: Needs assistance Equipment used: Rolling walker (2 wheeled) Transfers: Sit to/from Stand Sit to Stand: Min assist;+2 physical assistance;+2 safety/equipment         General transfer comment: Pt required +2 assist to power-up to full standing from the low recliner chair. Pt  states that she has a lift chair at home and normally does not need to power-up to stand.   Ambulation/Gait Ambulation/Gait assistance: Min assist;+2 safety/equipment Ambulation Distance (Feet): 75 Feet Assistive device: Rolling walker (2 wheeled) Gait Pattern/deviations: Step-through pattern;Decreased stride length;Trendelenburg;Trunk flexed Gait velocity: Decreased Gait velocity interpretation: Below normal speed for age/gender General Gait Details: Chair follow utilized. Pt was motivated for distance and did not require any seated rest breaks. +2 for safety as pt continues to demonstrate a slow and guarded gait pattern.    Stairs            Wheelchair Mobility    Modified Rankin (Stroke Patients Only)       Balance Overall balance assessment: Needs assistance Sitting-balance support: Feet supported;No upper extremity supported Sitting balance-Leahy Scale: Fair     Standing balance support: Bilateral upper extremity supported Standing balance-Leahy Scale: Poor                      Cognition Arousal/Alertness: Awake/alert Behavior During Therapy: WFL for tasks assessed/performed Overall Cognitive Status: No family/caregiver present to determine baseline cognitive functioning                      Exercises      General Comments        Pertinent Vitals/Pain Pain Assessment: No/denies pain    Home Living                      Prior Function  PT Goals (current goals can now be found in the care plan section) Acute Rehab PT Goals Patient Stated Goal: go home PT Goal Formulation: With patient Time For Goal Achievement: 03/13/15 Potential to Achieve Goals: Good Progress towards PT goals: Progressing toward goals    Frequency  Min 3X/week    PT Plan Current plan remains appropriate    Co-evaluation             End of Session Equipment Utilized During Treatment: Gait belt Activity Tolerance: Patient tolerated  treatment well Patient left: in chair;with call bell/phone within reach;with chair alarm set     Time: 5681-2751 PT Time Calculation (min) (ACUTE ONLY): 29 min  Charges:  $Gait Training: 8-22 mins $Therapeutic Activity: 8-22 mins                    G Codes:      Conni Slipper Mar 27, 2015, 3:16 PM   Conni Slipper, PT, DPT Acute Rehabilitation Services Pager: [phone]

## 2015-03-01 NOTE — Progress Notes (Signed)
Patient has requested to go to SNF prior to going home.  MD notified.

## 2015-03-01 NOTE — Progress Notes (Signed)
PT Cancellation Note  Patient Details Name: Patricia Klein MRN: [id number] DOB: 03-Sep-1928   Cancelled Treatment:    Reason Eval/Treat Not Completed: Patient at procedure or test/unavailable. Pt off unit at this time. Will check back as schedule allows.    Conni Slipper 03/01/2015, 10:35 AM   Conni Slipper, PT, DPT Acute Rehabilitation Services Pager: [phone]

## 2015-03-01 NOTE — Progress Notes (Signed)
Daily Rounding Note  03/01/2015, 8:39 AM  LOS: 2 days   SUBJECTIVE:       Eating 75 to 100% of last 2 meals.  swllowing overall better on D2 diet but some dysphagia last night.  Soft, formed, brown stool yesterday x 1.   No esophageal pain currently, no nausea  OBJECTIVE:         Vital signs in last 24 hours:    Temp:  [97.4 F (36.3 C)-98.2 F (36.8 C)] 97.9 F (36.6 C) (03/02 0441) Pulse Rate:  [67-76] 70 (03/02 0441) Resp:  [16-18] 16 (03/02 0441) BP: (107-119)/(37-84) 107/37 mmHg (03/02 0441) SpO2:  [92 %-99 %] 98 % (03/02 0441) Weight:  [236 lb 1.6 oz (107.094 kg)] 236 lb 1.6 oz (107.094 kg) (03/02 0033) Last BM Date: 02/28/15 Filed Weights   02/27/15 0627 02/27/15 2048 03/01/15 0033  Weight: 223 lb 5.2 oz (101.3 kg) 224 lb 8 oz (101.833 kg) 236 lb 1.6 oz (107.094 kg)   General: pleasant, aged, comfortable.  Not acutely ill looking   Heart: irreg, irreg Chest: clear bil.  No cough or dyspnea.  Abdomen: soft, active BS, NT, ND  Extremities: no CCE Neuro/Psych:  Pleasant, cooperative, not confused.   Intake/Output from previous day: 03/01 0701 - 03/02 0700 In: 720 [P.O.:720] Out: 251 [Urine:250; Stool:1]  Intake/Output this shift:    Lab Results:  Recent Labs  02/26/15 1926 02/27/15 0509 02/28/15 0900  WBC 1.8* 1.7* 2.5*  HGB 8.9* 8.6* 9.6*  HCT 26.3* 24.9* 28.8*  PLT 133* 124* 126*   BMET  Recent Labs  02/26/15 1926 02/27/15 0509 02/28/15 0900  NA 138 141 140  K 3.6 3.9 4.1  CL 105 108 111  CO2 27 26 23   GLUCOSE 92 86 104*  BUN 16 14 9   CREATININE 1.34* 1.29* 1.13*  CALCIUM 8.7 8.8 8.8   LFT  Recent Labs  02/26/15 1926 02/27/15 0509  PROT 6.7 6.4  ALBUMIN 3.1* 3.0*  AST 22 25  ALT 13 15  ALKPHOS 79 73  BILITOT 0.9 0.9   PT/INR  Recent Labs  02/27/15 0509 02/28/15 0900  LABPROT 32.1* 30.1*  INR 3.09* 2.84*     ASSESMENT:   * Dysphagia/odynophagia. History of  GERD, HH, Candida esophagitis. Wonder if she has recurrent Candida esophagitis though no fungal changes noted in oral/pharyngeal visual exam. Contributing to the dysphagia may be her poor dental repair and difficulty chewing.  *  Diarrhea 2/28, resolved in less than 24 hours, formed BM yesterday, none yet today. C  Diff negative.  Stool PCR in process, remains on enteric contact precautions.   * Normocytic anemia.  * Diarrhea for 1 day on 02/26/15. This is resolved. Stool pathogen panel is pending. Had been treated for UTI last week per [facility].  * Thrombocytopenia.  * Chronic Coumadin for history of A. fib, CVA and PE. INR 2.8, therapeutic.  * Chronic anemia. Normocytic. Per review of care everywhere, patient had been taking ferrous sulfate 325 MG daily as of mid January 2016.  * Rheumatoid arthritis. Takes methotrexate once per week. As of 12/06/14 her liver transaminases and total bilirubin were normal. Her alkaline phosphatase was up a bit at 106 (rr 32 - 98)    PLAN   *  Barium esophagram ordered. To start mid morning.  *  D/c enteric precautions once stool path panel is back.     February 2016  03/01/2015,  8:39 AM Pager: [phone]  GI Attending Note  I have personally taken an interval history, reviewed the chart, and examined the patient.  I agree with the extender's note, impression and recommendations.  Barbette Hair. Arlyce Dice, MD, [facility] [phone]

## 2015-03-01 NOTE — Progress Notes (Signed)
ANTICOAGULATION CONSULT NOTE - Follow Up Consult  Pharmacy Consult for Warfarin Indication: atrial fibrillation  Allergies  Allergen Reactions  . Gabapentin Itching  . Penicillins Itching  . Sulfa Antibiotics Itching  . Hydrocodone-Acetaminophen Itching    Patient Measurements: Weight: 236 lb 1.6 oz (107.094 kg)  Vital Signs: Temp: 97.5 F (36.4 C) (03/02 0912) Temp Source: Oral (03/02 0912) BP: 132/55 mmHg (03/02 0912) Pulse Rate: 96 (03/02 0912)  Labs:  Recent Labs  02/27/15 0509 02/27/15 0925 02/27/15 1540 02/28/15 0900 03/01/15 0719  HGB 8.6*  --   --  9.6* 9.3*  HCT 24.9*  --   --  28.8* 27.8*  PLT 124*  --   --  126* 151  LABPROT 32.1*  --   --  30.1* 28.9*  INR 3.09*  --   --  2.84* 2.70*  CREATININE 1.29*  --   --  1.13* 1.08  TROPONINI <0.03 <0.03 <0.03  --   --     CrCl cannot be calculated (Unknown ideal weight.).   Assessment: 76 YOF who presented on 2/28 with N/V/D and abdominal pain, admitted for further evaluation. Pharmacy was consulted to continue warfarin for anticoagulation. Admit INR was elevated at 3.3 likely due to recent N/V/D and poor appetite. FOBT was noted to be positive but no gross bleeding from rectum, last dose of Coumadin PTA was 2/28. Warfarin was held on 2/29 due to the elevated INR.   INR is therapeutic today after resuming warfarin yesterday evening (INR 2.7 << 2.84, goal of 2-3). CBC low but stable. GI on board for evaluation - planning a barium esophagram this AM. The patient is eating well (75-100% of meals charted).   PTA warfarin dose was 2 mg daily EXCEPT for 1 mg on Wed/Fri (dose confirmed with the patient). The patient was re-educated on warfarin today.   Goal of Therapy:  INR 2-3   Plan:  1. Warfarin 1 mg x 1 dose at 1800 today 2. Will continue to monitor for any signs/symptoms of bleeding and will follow up with PT/INR in the a.m.   Georgina Pillion, PharmD, BCPS Clinical Pharmacist Pager: [phone] 03/01/2015  10:03 AM

## 2015-03-01 NOTE — Progress Notes (Signed)
     Progress Note   Subjective  *Eating without difficulties today**   Objective  Vital signs in last 24 hours: Temp:  [97.4 F (36.3 C)-98.2 F (36.8 C)] 97.5 F (36.4 C) (03/02 0912) Pulse Rate:  [69-96] 96 (03/02 0912) Resp:  [16-18] 18 (03/02 0912) BP: (107-132)/(37-84) 132/55 mmHg (03/02 0912) SpO2:  [94 %-99 %] 99 % (03/02 0912) Weight:  [236 lb 1.6 oz (107.094 kg)] 236 lb 1.6 oz (107.094 kg) (03/02 0033) Last BM Date: 02/28/15 General:   Alert,  Well-developed,   in NAD Heart:  Regular rate and rhythm; no murmurs Abdomen:  Soft, nontender and nondistended. Normal bowel sounds, without guarding, and without rebound.   Extremities:  Without edema. Neurologic:  Alert and  oriented x4;  grossly normal neurologically. Psych:  Alert and cooperative. Normal mood and affect.  Intake/Output from previous day: 03/01 0701 - 03/02 0700 In: 720 [P.O.:720] Out: 251 [Urine:250; Stool:1] Intake/Output this shift: Total I/O In: 240 [P.O.:240] Out: -   Lab Results:  Recent Labs  02/27/15 0509 02/28/15 0900 03/01/15 0719  WBC 1.7* 2.5* 3.6*  HGB 8.6* 9.6* 9.3*  HCT 24.9* 28.8* 27.8*  PLT 124* 126* 151   BMET  Recent Labs  02/27/15 0509 02/28/15 0900 03/01/15 0719  NA 141 140 140  K 3.9 4.1 4.0  CL 108 111 110  CO2 26 23 24   GLUCOSE 86 104* 84  BUN 14 9 8   CREATININE 1.29* 1.13* 1.08  CALCIUM 8.8 8.8 8.7   LFT  Recent Labs  02/27/15 0509  PROT 6.4  ALBUMIN 3.0*  AST 25  ALT 15  ALKPHOS 73  BILITOT 0.9   PT/INR  Recent Labs  02/28/15 0900 03/01/15 0719  LABPROT 30.1* 28.9*  INR 2.84* 2.70*   Hepatitis Panel No results for input(s): HEPBSAG, HCVAB, HEPAIGM, HEPBIGM in the last 72 hours.  Studies/Results: Dg Esophagus  03/01/2015   CLINICAL DATA:  Dysphagia.  EXAM: ESOPHOGRAM/BARIUM SWALLOW  TECHNIQUE: Single contrast examination was performed using  thin barium.  FLUOROSCOPY TIME:  3 min and 12 seconds  COMPARISON:  Chest radiograph of  02/26/2015  FINDINGS: Focused exam was performed with the patient in an LPO position. Moderate limitations secondary to patient immobility and clinical status.  Throughout the exam, including on the initial swallow, there is moderate to marked esophageal dysmotility, with contrast stasis throughout the esophagus. No persistent narrowing to suggest stricture. Small hiatal hernia.  IMPRESSION: 1. Esophageal dysmotility, likely presbyesophagus. 2. Small hiatal hernia. 3. Limited exam, as detailed above.   Electronically Signed   By: 05/01/2015 M.D.   On: 03/01/2015 11:40      Assessment & Plan  *Dysphagia.  Barium esophagram demonstrates esophageal dysmotility only.  There is no evidence for candida infection by oral pharyngeal exam.  There is nothing more to do from a GI standpoint. ** Principal Problem:   Nausea vomiting and diarrhea Active Problems:   HYPERTENSION, BENIGN SYSTEMIC   Atrial fibrillation   Asthma   CHF (congestive heart failure)   Hypertension   CAD (coronary artery disease)   CKD (chronic kidney disease), stage III   Chest pain   RA (rheumatoid arthritis)   Pancytopenia   Dysphagia, pharyngoesophageal phase  signing off   LOS: 2 days   Jeronimo Greaves  03/01/2015, 12:04 PM

## 2015-03-02 LAB — PROTIME-INR
INR: 2.93 — ABNORMAL HIGH (ref 0.00–1.49)
PROTHROMBIN TIME: 30.8 s — AB (ref 11.6–15.2)

## 2015-03-02 MED ORDER — POTASSIUM CHLORIDE CRYS ER 20 MEQ PO TBCR: 20.0000 meq | EXTENDED_RELEASE_TABLET | Freq: Every day | ORAL | Status: AC

## 2015-03-02 MED ORDER — FUROSEMIDE 40 MG PO TABS: 40.0000 mg | ORAL_TABLET | Freq: Every day | ORAL | Status: AC

## 2015-03-02 MED ORDER — WARFARIN SODIUM 1 MG PO TABS
1.0000 mg | ORAL_TABLET | Freq: Once | ORAL | Status: AC
  Administered 2015-03-02: 1 mg via ORAL
  Filled 2015-03-02: qty 1

## 2015-03-02 NOTE — Clinical Social Work Psychosocial (Signed)
Clinical Social Work Department BRIEF PSYCHOSOCIAL ASSESSMENT 03/02/2015  Patient:  Patricia Klein, Patricia Klein     Account Number:  [id number]     Admit date:  02/26/2015  Clinical Social Worker:  Arrie Aran,  Date/Time:  03/02/2015 01:15 PM  Referred by:  Physician  Date Referred:  03/02/2015 Referred for  SNF Placement   Other Referral:   Interview type:  Patient Other interview type:    PSYCHOSOCIAL DATA Living Status:  WITH ADULT CHILDREN Admitted from facility:   Level of care:   Primary support name:  Shirlean Kelly Primary support relationship to patient:  CHILD, ADULT Degree of support available:   Good support.  Corrie Dandy [place]  Daughter  [phone]    CURRENT CONCERNS Current Concerns  Post-Acute Placement   Other Concerns:    SOCIAL WORK ASSESSMENT / PLAN CSW-Intern spoke with Ms.Rodden at bedside concerning short-term rehab recommendation. Patient is agreeable to rehab and prefers [facility]. Patient reported she has been the rehab twice in the past and would like to return. CSW-Intern informed Ms.Prude that all facilities in [facility] will be contacted. Patient requested for her daughter to be contacted , since she manages her care.    CSW-Intern attempted to contact  her daughter Corrie Dandy by phone , and left a message.   Assessment/plan status:  Psychosocial Support/Ongoing Assessment of Needs Other assessment/ plan:   Information/referral to community resources:   Patient denied local facility list. CSW-Intern will make contact with [facility] to see if they will accept her .    PATIENT'S/FAMILY'S RESPONSE TO PLAN OF CARE: Patient was responsive and pleasant speaking to CSW-Intern.

## 2015-03-02 NOTE — Clinical Social Work Placement (Signed)
Clinical Social Work Department CLINICAL SOCIAL WORK PLACEMENT NOTE 03/02/2015  Patient:  ALVETA, QUINTELA  Account Number:  [id number] Admit date:  02/26/2015  Clinical Social Worker:  Arrie Aran,  Date/time:  03/02/2015 01:34 PM  Clinical Social Work is seeking post-discharge placement for this patient at the following level of care:      (*CSW will update this form in Epic as items are completed)   03/02/2015  Patient/family provided with [facility] Department of Clinical Social Work's list of facilities offering this level of care within the geographic area requested by the patient (or if unable, by the patient's family).  03/02/2015  Patient/family informed of their freedom to choose among providers that offer the needed level of care, that participate in Medicare, Medicaid or managed care program needed by the patient, have an available bed and are willing to accept the patient.  03/02/2015  Patient/family informed of [facility]' ownership interest in [facility], as well as of the fact that they are under no obligation to receive care at this facility.  PASARR submitted to EDS on 03/02/2015 PASARR number received on 03/02/2015  FL2 transmitted to all facilities in geographic area requested by pt/family on  03/02/2015 FL2 transmitted to all facilities within larger geographic area on   Patient informed that his/her managed care company has contracts with or will negotiate with  certain facilities, including the following:     Patient/family informed of bed offers received:  03/02/2015 Patient chooses bed at : [facility] and [facility] Physician recommends and patient chooses bed at    Patient to be transferred on 03/02/2015 Patient to be transferred to facility by [facility] Patient and family notified of transfer on 03/02/2015 Name of family member notified:  Shirlean Kelly -Daughter [phone])  The following physician request were entered in  Epic:   Additional Comments: Patient will be transported by ambulance to [facility].  Patient's daughter Shirlean Kelly, contacted by phone and notified of transport to facility.

## 2015-03-02 NOTE — Progress Notes (Signed)
Report called to Barbie Haggis at [facility].  All questions answered.

## 2015-03-02 NOTE — Progress Notes (Signed)
ANTICOAGULATION CONSULT NOTE - Follow Up Consult  Pharmacy Consult for Warfarin Indication: atrial fibrillation  Allergies  Allergen Reactions  . Gabapentin Itching  . Penicillins Itching  . Sulfa Antibiotics Itching  . Hydrocodone-Acetaminophen Itching    Patient Measurements: Height: 5\' 3"  (160 cm) Weight: 232 lb 3.2 oz (105.325 kg) IBW/kg (Calculated) : 52.4  Vital Signs: Temp: 98.2 F (36.8 C) (03/03 0949) Temp Source: Oral (03/03 0949) BP: 109/56 mmHg (03/03 0949) Pulse Rate: 70 (03/03 0949)  Labs:  Recent Labs  02/27/15 1540 02/28/15 0900 03/01/15 0719 03/02/15 0320  HGB  --  9.6* 9.3*  --   HCT  --  28.8* 27.8*  --   PLT  --  126* 151  --   LABPROT  --  30.1* 28.9* 30.8*  INR  --  2.84* 2.70* 2.93*  CREATININE  --  1.13* 1.08  --   TROPONINI <0.03  --   --   --     Estimated Creatinine Clearance: 45.1 mL/min (by C-G formula based on Cr of 1.08).   Assessment: 79 YOF who presented on 2/28 with N/V/D and abdominal pain, admitted for further evaluation. Pharmacy was consulted to continue warfarin for anticoagulation. Admit INR was elevated at 3.3 likely due to recent N/V/D and poor appetite. FOBT was noted to be positive but no gross bleeding from rectum. GI has signed off. Last dose of Coumadin PTA was 2/28. Warfarin was held on 2/29 due to the elevated INR.   Warfarin resumed on 3/1 and INR remains therapeutic today at 2.93. No new CBC this morning- yesterday's was stable and no bleeding it noted.  PTA warfarin dose was 2 mg daily EXCEPT for 1 mg on Wed/Fri (dose confirmed with the patient). The patient was re-educated on warfarin today.   Goal of Therapy:  INR 2-3   Plan:  -Warfarin 1 mg x 1 tonight as per home dosing -daily INR -CBC in the morning -follow for s/s bleeding  Geary Rufo D. Nancy Arvin, PharmD, BCPS Clinical Pharmacist Pager: [phone] 03/02/2015 10:19 AM

## 2015-03-02 NOTE — Discharge Summary (Signed)
Physician Discharge Summary  Patricia Klein JFH:[id number] DOB: 1928-02-14 DOA: 02/26/2015  PCP: No primary care provider on file.  Admit date: 02/26/2015 Discharge date: 03/02/2015  Time spent: > 35 minutes  Recommendations for Outpatient Follow-up:  1. Patient's metoprolol and lasix will be held. Will set date to continue lasix next Monday (following Monday after discharge) the patient's blood pressure permits and if she is consistently eating well. When Lasix is continued patient should also be on potassium replacement. 2. Patient should be on dysphagia diet to give an esophageal dysmotility 3. Pharmacy should continue dosing Coumadin  Discharge Diagnoses:  Principal Problem:   Nausea vomiting and diarrhea Active Problems:   HYPERTENSION, BENIGN SYSTEMIC   Atrial fibrillation   Asthma   CHF (congestive heart failure)   Hypertension   CAD (coronary artery disease)   CKD (chronic kidney disease), stage III   Chest pain   RA (rheumatoid arthritis)   Pancytopenia   Dysphagia, pharyngoesophageal phase   Discharge Condition: Stable  Diet recommendation: *Dysphagia 2 diet  Filed Weights   02/27/15 2048 03/01/15 0033 03/01/15 2000  Weight: 101.833 kg (224 lb 8 oz) 107.094 kg (236 lb 1.6 oz) 105.325 kg (232 lb 3.2 oz)    History of present illness:  From original history of present illness: 81 y.o. female with PMH of congestive heart failure, atrial fibrillation on Coumadin, coronary artery disease, stroke, hyperlipidemia, rheumatoid arthritis, chronic kidney disease-III, pancytopenia, coronary artery disease, who presents with nausea, vomiting, diarrhea, abdominal pain.  Patient reports that has nausea, vomiting, diarrhea in the past 2 weeks. She had 4 bowel movements with watery stool today. She was recently treated with abx for UTI in [facility]. Patient states she has epigastric/chest pain that is worse with eating and is burning. Pain is constant. No shortness  of breath. She also complains of leg edema due to CHF and venous insufficiency. She reports that she is compliant to her diuretics at home. She was recently seen by her primary care provider and was diagnosed with thrombocytopenia and possible gastritis. Patient with GI follow-up March 8. She was given referral to hematology. She has stopped taking metoprolol. Patient denies fever, chills, dysuria, urgency, frequency, hematuria, skin rashes, No unilateral weakness, numbness or tingling sensations. No vision change or hearing loss.  In ED, patient was found to have pancytopenia with stable hemoglobin, WBC 1.8, platelets 133, hemoglobin 8.9, negative troponin, active chest x-ray, temperature normal, blood pressure is soft, and INR 3.3. Urinalysis is dirty catch with many squamous cells. She is admitted to the inpatient further evaluation and treatment.  Hospital Course:  1. Dysphagia/odynophagia - GI on board while patient was in house. Based on their evaluation:Barium esophagram demonstrates esophageal dysmotility only. There is no evidence for candida infection by oral pharyngeal exam. Patient to be placed on dysphagia 2 diet moving forward  2. Viral gastroenteritis - resolving   3. Atypical chest pain. Secondary to odynophagia/dysphasia. continues to be chest pain-free, troponin negative 3. Continue combination of aspirin statin and Coumadin for secondary prevention.   4. Chronic Atrial Fibrillation: CHA2DS2-VASc Score 7  - Pharmacy managing Coumadin - Currently on diltiazem   5. Chr diastolic dysfunction EF 55-60% this admission. Compensated currently -We'll hold beta blocker given soft blood pressures as well as Lasix. These may be continued as blood pressure improves with improved oral intake and forward.   6. History of asthma.  - Stable   7. History of rheumatoid arthritis. On methotrexate which is being held  as she developed pancytopenia upon admission. Outpatient follow-up  with her rheumatologist.   8. Pancytopenia. Could be secondary to methotrexate which is on hold, blood counts improving, will have her follow with her primary rheumatologist post discharge.   9. ARF on chronic kidney disease stage III. Baseline creatinine 1.3. Was mildly dehydrated, After hydration she is at baseline.  Procedures:  None  Consultations:  GI  Discharge Exam: Filed Vitals:   03/02/15 0949  BP: 109/56  Pulse: 70  Temp: 98.2 F (36.8 C)  Resp: 17    General: Asian no acute distress, alert and awake Cardiovascular: No cyanosis, no chest pain Respiratory: No increased work of breathing, no wheezes  Discharge Instructions   Discharge Instructions    Call MD for:  extreme fatigue    Complete by:  As directed      Call MD for:  severe uncontrolled pain    Complete by:  As directed      Call MD for:  temperature >100.4    Complete by:  As directed      Diet - low sodium heart healthy    Complete by:  As directed      Discharge instructions    Complete by:  As directed   Please have patient reevaluated by pcp at facility within the next 1 week.  Monitor INR and have pharmacy continue to monitor and dose. Patient has esophageal dysmotility as such patient will need to continue dysphagia 2 diet.     Increase activity slowly    Complete by:  As directed           Current Discharge Medication List    CONTINUE these medications which have CHANGED   Details  furosemide (LASIX) 40 MG tablet Take 1 tablet (40 mg total) by mouth daily. Qty: 30 tablet, Refills: 2    potassium chloride SA (K-DUR,KLOR-CON) 20 MEQ tablet Take 1 tablet (20 mEq total) by mouth daily.      CONTINUE these medications which have NOT CHANGED   Details  albuterol (PROVENTIL HFA;VENTOLIN HFA) 108 (90 BASE) MCG/ACT inhaler Inhale 2 puffs into the lungs every 6 (six) hours as needed for wheezing or shortness of breath.     aspirin EC 81 MG tablet Take 81 mg by mouth daily.     cholecalciferol (VITAMIN D) 1000 UNITS tablet Take 1,000 Units by mouth daily.    diltiazem (CARDIZEM) 30 MG tablet Take 30 mg by mouth 2 (two) times daily. Refills: 5    nitroGLYCERIN (NITROSTAT) 0.4 MG SL tablet Place 0.4 mg under the tongue every 5 (five) minutes as needed for chest pain.     nystatin (MYCOSTATIN/NYSTOP) 100000 UNIT/GM POWD Apply topically 3 (three) times daily as needed (irritation under breasts, under stomach folds and vaginal area).  Refills: 5    pravastatin (PRAVACHOL) 20 MG tablet Take 20 mg by mouth at bedtime.  Refills: 10    sennosides-docusate sodium (SENOKOT-S) 8.6-50 MG tablet Take 1 tablet by mouth daily as needed.     warfarin (COUMADIN) 2 MG tablet Take 1-2 mg by mouth daily after supper. Take 1/2 tablet (1 mg) on Wednesday and Friday at 5pm; take 1 tablet (2 mg) on Sunday, Monday, Tuesday, Thursday, Saturday at 5pm Refills: 2      STOP taking these medications     metoprolol (LOPRESSOR) 100 MG tablet      oxyCODONE-acetaminophen (PERCOCET/ROXICET) 5-325 MG per tablet      traMADol (ULTRAM) 50 MG tablet  methotrexate (RHEUMATREX) 2.5 MG tablet        Allergies  Allergen Reactions  . Gabapentin Itching  . Penicillins Itching  . Sulfa Antibiotics Itching  . Hydrocodone-Acetaminophen Itching      The results of significant diagnostics from this hospitalization (including imaging, microbiology, ancillary and laboratory) are listed below for reference.    Significant Diagnostic Studies: Dg Chest 2 View  02/26/2015   CLINICAL DATA:  Chest pain and weakness for 2 days  EXAM: CHEST  2 VIEW  COMPARISON:  02/14/2015  FINDINGS: Cardiac shadow remains enlarged. A pacing device is again seen. Degenerative changes of the shoulder joints are again noted bilaterally. No focal infiltrate or sizable effusion is seen.  IMPRESSION: No acute abnormality noted.   Electronically Signed   By: Alcide Clever M.D.   On: 02/26/2015 20:06   Dg  Esophagus  03/01/2015   CLINICAL DATA:  Dysphagia.  EXAM: ESOPHOGRAM/BARIUM SWALLOW  TECHNIQUE: Single contrast examination was performed using  thin barium.  FLUOROSCOPY TIME:  3 min and 12 seconds  COMPARISON:  Chest radiograph of 02/26/2015  FINDINGS: Focused exam was performed with the patient in an LPO position. Moderate limitations secondary to patient immobility and clinical status.  Throughout the exam, including on the initial swallow, there is moderate to marked esophageal dysmotility, with contrast stasis throughout the esophagus. No persistent narrowing to suggest stricture. Small hiatal hernia.  IMPRESSION: 1. Esophageal dysmotility, likely presbyesophagus. 2. Small hiatal hernia. 3. Limited exam, as detailed above.   Electronically Signed   By: Jeronimo Greaves M.D.   On: 03/01/2015 11:40    Microbiology: Recent Results (from the past 240 hour(s))  Culture, blood (routine x 2)     Status: None (Preliminary result)   Collection Time: 02/27/15  5:09 AM  Result Value Ref Range Status   Specimen Description BLOOD LEFT HAND  Final   Special Requests BOTTLES DRAWN AEROBIC ONLY 2CC  Final   Culture   Final           BLOOD CULTURE RECEIVED NO GROWTH TO DATE CULTURE WILL BE HELD FOR 5 DAYS BEFORE ISSUING A FINAL NEGATIVE REPORT Performed at [facility]    Report Status PENDING  Incomplete  Culture, blood (routine x 2)     Status: None (Preliminary result)   Collection Time: 02/27/15  5:21 AM  Result Value Ref Range Status   Specimen Description BLOOD LEFT FOREARM  Final   Special Requests BOTTLES DRAWN AEROBIC AND ANAEROBIC 10CC EACH  Final   Culture   Final           BLOOD CULTURE RECEIVED NO GROWTH TO DATE CULTURE WILL BE HELD FOR 5 DAYS BEFORE ISSUING A FINAL NEGATIVE REPORT Performed at [facility]    Report Status PENDING  Incomplete  Urine culture     Status: None   Collection Time: 02/27/15  6:31 PM  Result Value Ref Range Status   Specimen Description URINE,  CLEAN CATCH  Final   Special Requests NONE  Final   Colony Count   Final    >=100,000 COLONIES/ML Performed at [facility]    Culture   Final    Multiple bacterial morphotypes present, none predominant. Suggest appropriate recollection if clinically indicated. Performed at [facility]    Report Status 03/01/2015 FINAL  Final  Clostridium Difficile by PCR     Status: None   Collection Time: 02/28/15  9:37 AM  Result Value Ref Range Status  C difficile by pcr NEGATIVE NEGATIVE Final     Labs: Basic Metabolic Panel:  Recent Labs Lab 02/26/15 1926 02/27/15 0509 02/28/15 0900 03/01/15 0719  NA 138 141 140 140  K 3.6 3.9 4.1 4.0  CL 105 108 111 110  CO2 27 26 23 24   GLUCOSE 92 86 104* 84  BUN 16 14 9 8   CREATININE 1.34* 1.29* 1.13* 1.08  CALCIUM 8.7 8.8 8.8 8.7   Liver Function Tests:  Recent Labs Lab 02/26/15 1926 02/27/15 0509  AST 22 25  ALT 13 15  ALKPHOS 79 73  BILITOT 0.9 0.9  PROT 6.7 6.4  ALBUMIN 3.1* 3.0*    Recent Labs Lab 02/26/15 1926  LIPASE 33   No results for input(s): AMMONIA in the last 168 hours. CBC:  Recent Labs Lab 02/26/15 1926 02/27/15 0509 02/28/15 0900 03/01/15 0719  WBC 1.8* 1.7* 2.5* 3.6*  NEUTROABS 0.5*  --   --   --   HGB 8.9* 8.6* 9.6* 9.3*  HCT 26.3* 24.9* 28.8* 27.8*  MCV 88.6 89.2 91.4 90.6  PLT 133* 124* 126* 151   Cardiac Enzymes:  Recent Labs Lab 02/27/15 0509 02/27/15 0925 02/27/15 1540  TROPONINI <0.03 <0.03 <0.03   BNP: BNP (last 3 results)  Recent Labs  02/27/15 0521  BNP 426.0*    ProBNP (last 3 results) No results for input(s): PROBNP in the last 8760 hours.  CBG: No results for input(s): GLUCAP in the last 168 hours.     Signed:  Penny Pia  [facility] 03/02/2015, 1:00 PM

## 2015-03-02 NOTE — Progress Notes (Signed)
Occupational Therapy Treatment Patient Details Name: Patricia Klein MRN: [id number] DOB: 19-Oct-1928 Today's Date: 03/02/2015       OT comments  Pt now agreeable to SNF!   Follow Up Recommendations  SNF    Equipment Recommendations       Recommendations for Other Services      Precautions / Restrictions         Mobility Bed Mobility               General bed mobility comments: pt in chair  Transfers Overall transfer level: Needs assistance Equipment used: Rolling walker (2 wheeled) Transfers: Sit to/from Stand Sit to Stand: Mod assist              Balance                                   ADL Overall ADL's : Needs assistance/impaired     Grooming: Oral care;Set up;Sitting               Lower Body Dressing: With adaptive equipment;Sitting/lateral leans;Maximal assistance;Cueing for safety;Cueing for sequencing                 General ADL Comments: issued sock aid, long reacher and sponge      Vision                     Perception     Praxis      Cognition   Behavior During Therapy: WFL for tasks assessed/performed Overall Cognitive Status: No family/caregiver present to determine baseline cognitive functioning                       Extremity/Trunk Assessment               Exercises     Shoulder Instructions       General Comments      Pertinent Vitals/ Pain       Pain Assessment: No/denies pain  Home Living                                          Prior Functioning/Environment              Frequency       Progress Toward Goals  OT Goals(current goals can now be found in the care plan section)  Progress towards OT goals: Progressing toward goals  Acute Rehab OT Goals Patient Stated Goal: pt now agreeable to SNF! OT Goal Formulation: With patient  Plan Discharge plan needs to be updated    Co-evaluation                 End of Session      Activity Tolerance Patient tolerated treatment well   Patient Left in chair;with call bell/phone within reach;with chair alarm set   Nurse Communication Mobility status        Time: 1000-1023 OT Time Calculation (min): 23 min  Charges: OT General Charges $OT Visit: 1 Procedure OT Treatments $Self Care/Home Management : 23-37 mins  Pippa Hanif, [place] 03/02/2015, 10:27 AM

## 2015-03-02 NOTE — Progress Notes (Signed)
Order in EPIC to schedule appointment for primary care physician and schedule appointment for rheumatologist.  Showed order to [facility], DD and was directed to social work.  Per Erie Noe, SW have MD put order on discharge summary as patient is going to a skilled facility.  Spoke with Dr. Cena Benton and was instructed to ask the patient which dr she sees.  Patient stated that she does not see a rheumatologist.

## 2015-03-03 LAB — GI PATHOGEN PANEL BY PCR, STOOL
C difficile toxin A/B: NOT DETECTED
Campylobacter by PCR: NOT DETECTED
Cryptosporidium by PCR: NOT DETECTED
E COLI 0157 BY PCR: NOT DETECTED
E coli (ETEC) LT/ST: NOT DETECTED
E coli (STEC): NOT DETECTED
G lamblia by PCR: NOT DETECTED
Norovirus GI/GII: NOT DETECTED
Rotavirus A by PCR: NOT DETECTED
SALMONELLA BY PCR: NOT DETECTED
SHIGELLA BY PCR: NOT DETECTED

## 2015-03-04 NOTE — Progress Notes (Signed)
Patient's daughter Corrie Dandy [place], [phone]) called regarding her mother's discharge on 03-02-2015.  Pt. Was discharged to [facility]).  The daughter was not pleased with the care received at [facility] and subsequently took the patient home.  The daughter stated that the social worked was off for a few days at [facility] ([facility]).  Daughter requesting our assistance in getting her mother in another SNF.  I spoke with the daughter for 24 minutes.  On-call Child psychotherapist contacted.  SW stated that we do not arrange SNF placement from home, because Medicare requires a qualifying stay.  The patient would have to be readmitted to the hospital and have a qualifying stay, before we could arrange SNF placement.  Notified patient's daughter Corrie Dandy [facility]).  Patient's daughter was mad about this and did not want to hear what I had to say.  She stated that she didn't think the qualifying stay was necessary.  I reiterated that Medicare requires it.  She still was not pleased with this and said she would handle the situation on her own.  Peri Maris, MBA, BS, RN

## 2015-03-05 LAB — CULTURE, BLOOD (ROUTINE X 2)
CULTURE: NO GROWTH
Culture: NO GROWTH

## 2015-03-06 IMAGING — RF DG ESOPHAGUS
7 of 9 series · 15 of 18 positions shown · non-contrast
Comparison: Chest radiograph of 02/26/2015

CLINICAL DATA: Dysphagia.

EXAM:
ESOPHOGRAM/BARIUM SWALLOW
TECHNIQUE: Single contrast examination was performed using  thin barium.
FLUOROSCOPY TIME:  3 min and 12 seconds

[Series 1: cp_standard · 0.36mm/px · 3 of 260 frames shown (1 of 7)]
[frame 1/260]
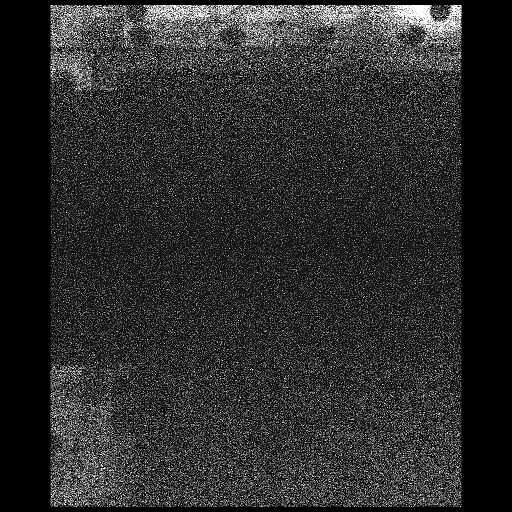
[frame 40/260]
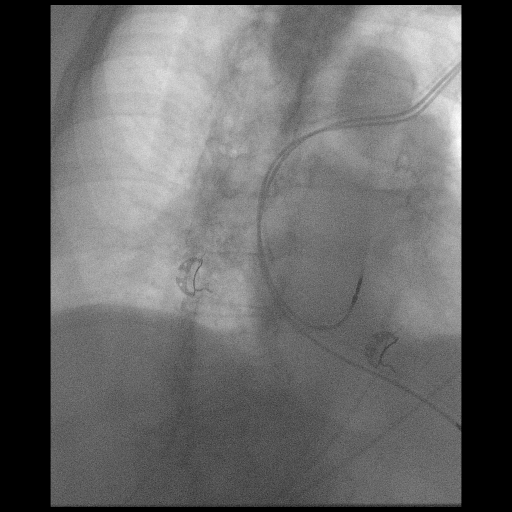
[frame 222/260]
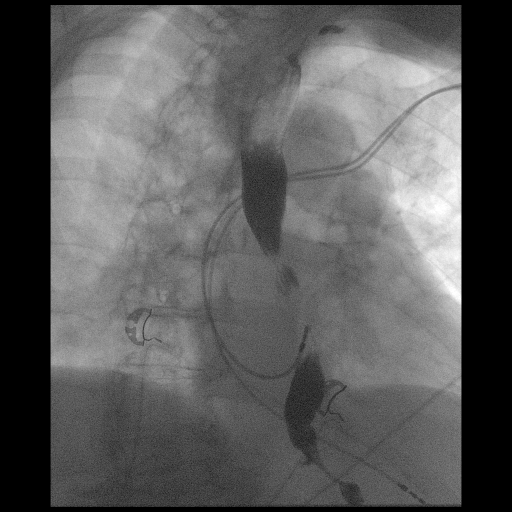

[Series 2: cp_standard · 0.36mm/px · 4 of 142 frames shown (2 of 7)]
[frame 22/142]
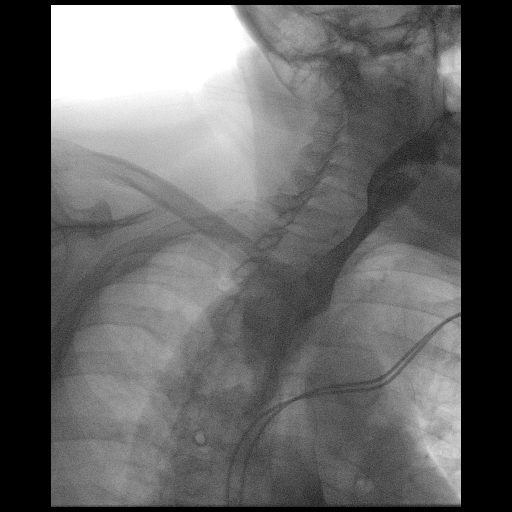
[frame 72/142]
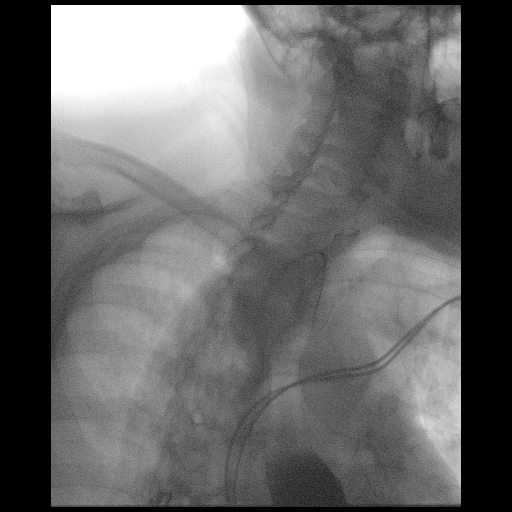
[frame 111/142]
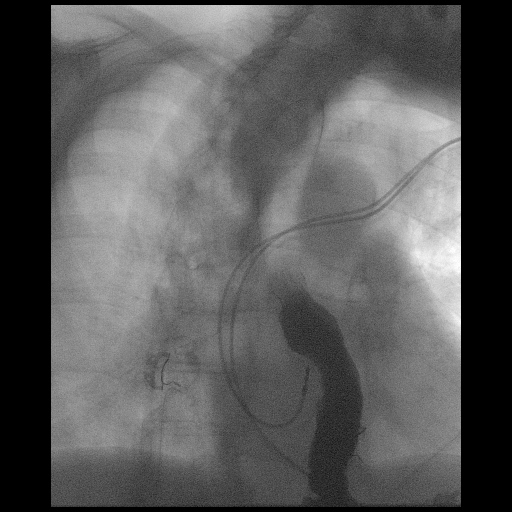
[frame 121/142]
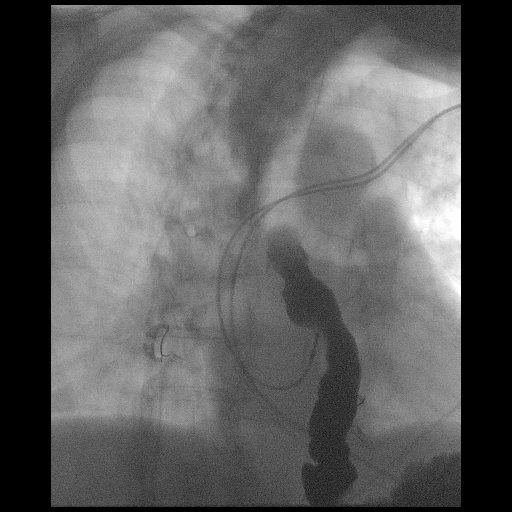

[Series 4: cp_standard · 0.18mm/px · 1 of 1 slices shown (3 of 7)]
[im 1/1]
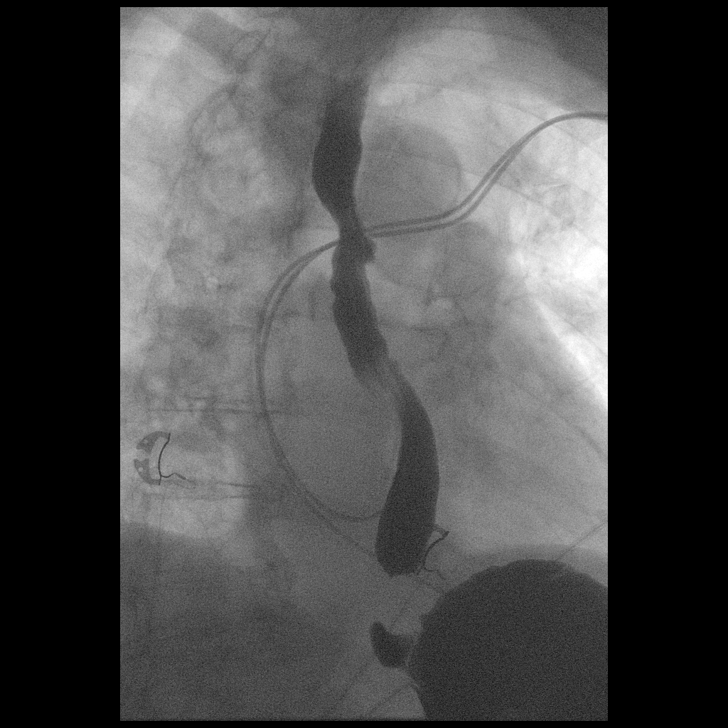

[Series 5: cp_standard · 0.36mm/px · 4 of 84 frames shown (4 of 7)]
[frame 8/84]
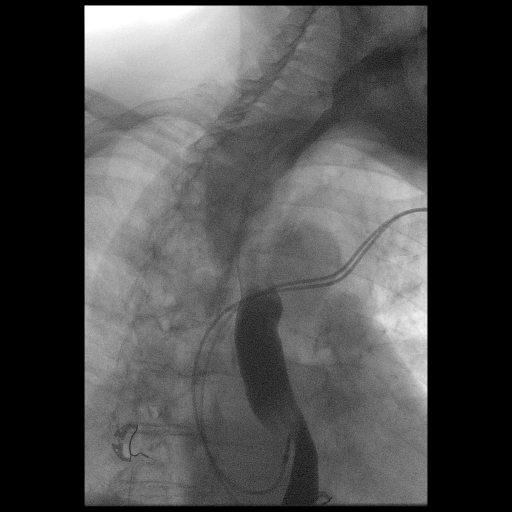
[frame 13/84]
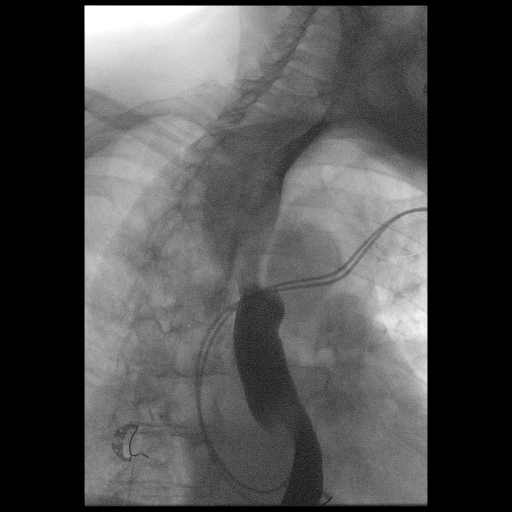
[frame 43/84]
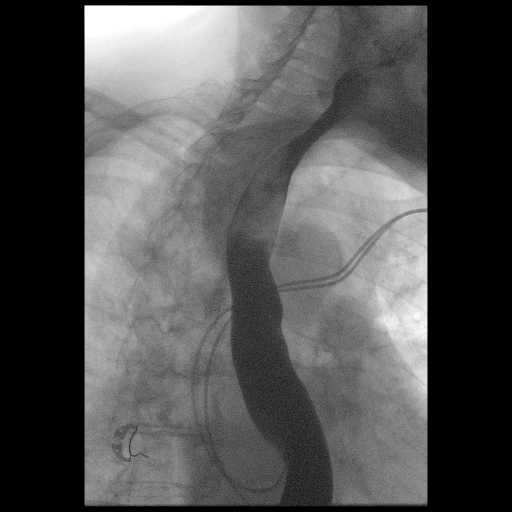
[frame 72/84]
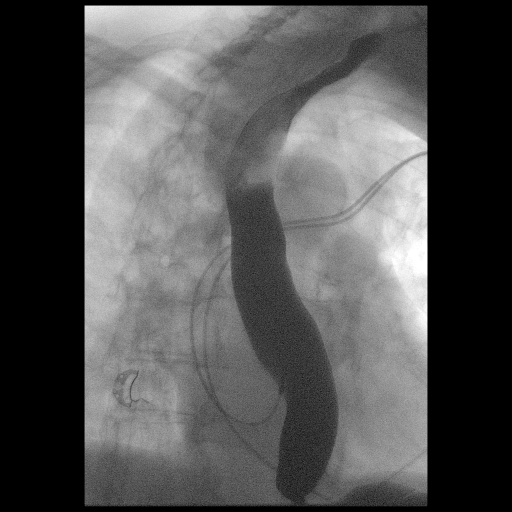

[Series 7: cp_standard · 0.18mm/px · 1 of 1 slices shown (5 of 7)]
[im 1/1]
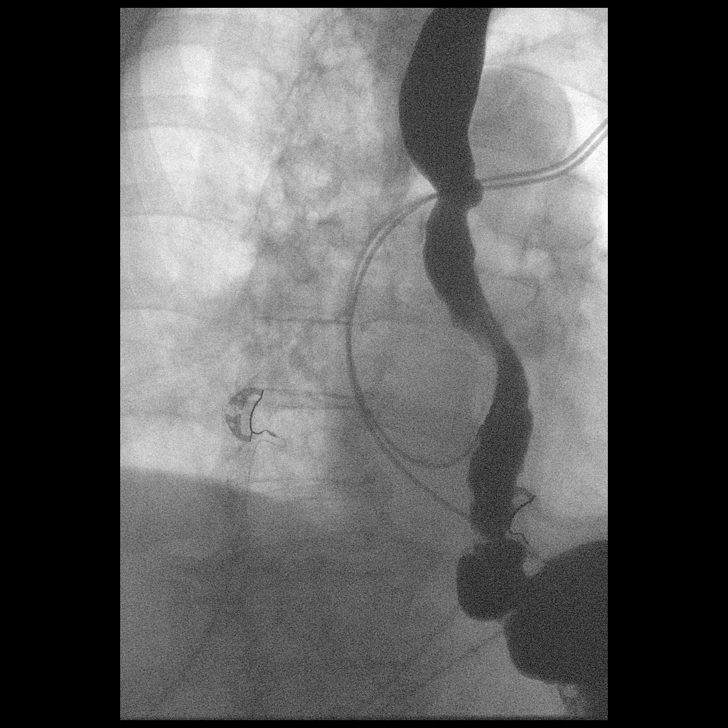

[Series 8: cp_standard · 0.18mm/px · 1 of 1 slices shown (6 of 7)]
[im 1/1]
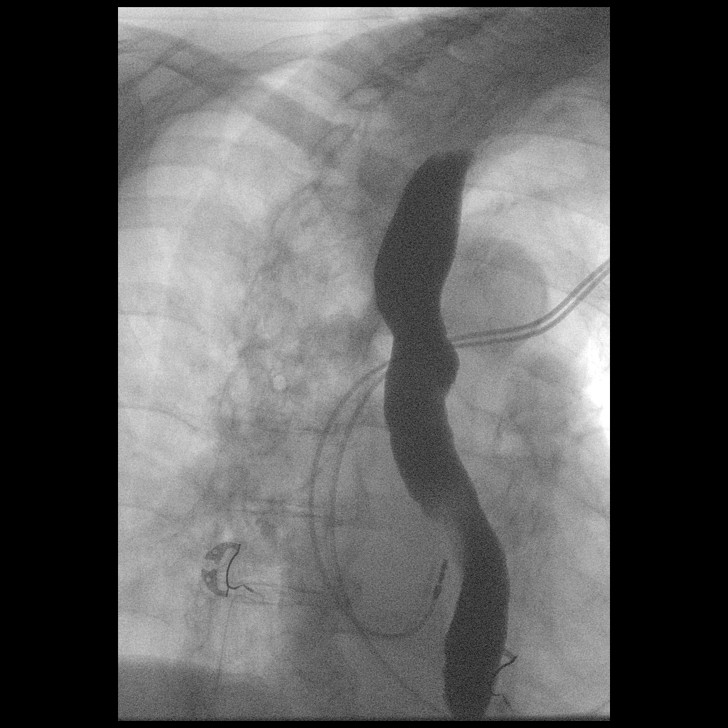

[Series 9: cp_standard · 0.18mm/px · 1 of 1 slices shown (7 of 7)]
[im 1/1]
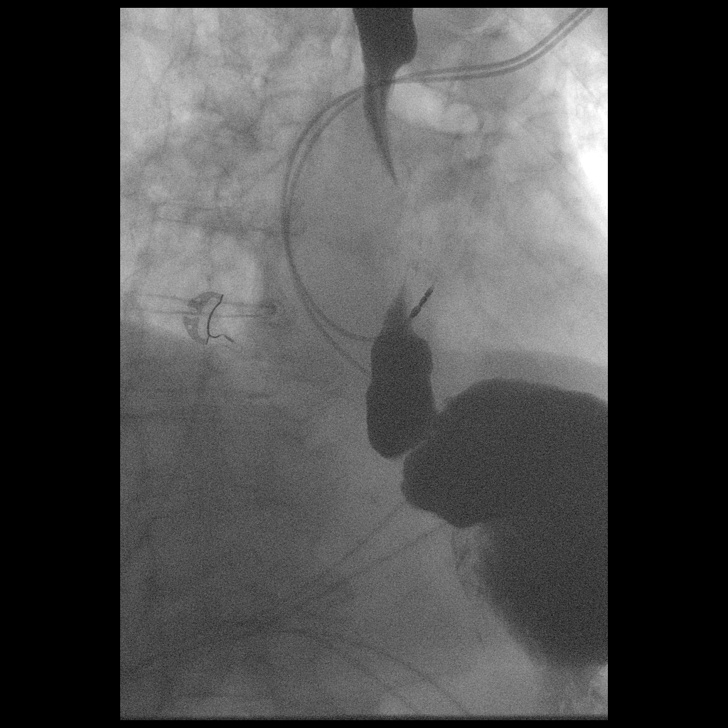

[15 of 18 positions shown; findings below may reference images not displayed]

FINDINGS: Focused exam was performed with the patient in an LPO position.
Moderate limitations secondary to patient immobility and clinical
status.

Throughout the exam, including on the initial swallow, there is
moderate to marked esophageal dysmotility, with contrast stasis
throughout the esophagus. No persistent narrowing to suggest
stricture. Small hiatal hernia.
IMPRESSION: 1. Esophageal dysmotility, likely presbyesophagus.
2. Small hiatal hernia.
3. Limited exam, as detailed above.

## 2015-06-30 DEATH — deceased
